# Patient Record
Sex: Male | Born: 1960 | Race: White | Hispanic: No | Marital: Married | Smoking: Never smoker
Health system: Southern US, Community
[De-identification: ages and names within clinical notes are randomized; demographics above are authoritative.]

## PROBLEM LIST (undated history)

## (undated) DIAGNOSIS — M1990 Unspecified osteoarthritis, unspecified site: Secondary | ICD-10-CM

## (undated) DIAGNOSIS — C801 Malignant (primary) neoplasm, unspecified: Secondary | ICD-10-CM

## (undated) DIAGNOSIS — I10 Essential (primary) hypertension: Secondary | ICD-10-CM

## (undated) HISTORY — PX: OTHER SURGICAL HISTORY: SHX169

---

## 2013-12-03 ENCOUNTER — Encounter (HOSPITAL_COMMUNITY): Payer: Self-pay | Admitting: Emergency Medicine

## 2013-12-03 ENCOUNTER — Emergency Department (HOSPITAL_COMMUNITY)
Admission: EM | Admit: 2013-12-03 | Discharge: 2013-12-03 | Disposition: A | Discharge status: Home / Self Care | Payer: 59 | Attending: Emergency Medicine | Admitting: Emergency Medicine

## 2013-12-03 DIAGNOSIS — L03119 Cellulitis of unspecified part of limb: Principal | ICD-10-CM

## 2013-12-03 DIAGNOSIS — L02419 Cutaneous abscess of limb, unspecified: Secondary | ICD-10-CM | POA: Insufficient documentation

## 2013-12-03 DIAGNOSIS — Z792 Long term (current) use of antibiotics: Secondary | ICD-10-CM | POA: Insufficient documentation

## 2013-12-03 DIAGNOSIS — I10 Essential (primary) hypertension: Secondary | ICD-10-CM | POA: Insufficient documentation

## 2013-12-03 DIAGNOSIS — Z880 Allergy status to penicillin: Secondary | ICD-10-CM | POA: Insufficient documentation

## 2013-12-03 DIAGNOSIS — L03116 Cellulitis of left lower limb: Secondary | ICD-10-CM

## 2013-12-03 DIAGNOSIS — Z981 Arthrodesis status: Secondary | ICD-10-CM | POA: Insufficient documentation

## 2013-12-03 DIAGNOSIS — Z791 Long term (current) use of non-steroidal anti-inflammatories (NSAID): Secondary | ICD-10-CM | POA: Insufficient documentation

## 2013-12-03 DIAGNOSIS — Z79899 Other long term (current) drug therapy: Secondary | ICD-10-CM | POA: Insufficient documentation

## 2013-12-03 HISTORY — DX: Essential (primary) hypertension: I10

## 2013-12-03 LAB — CBC WITH DIFFERENTIAL/PLATELET
Basophils Absolute: 0 10*3/uL (ref 0.0–0.1)
Basophils Relative: 0 % (ref 0–1)
Eosinophils Absolute: 0 10*3/uL (ref 0.0–0.7)
Eosinophils Relative: 0 % (ref 0–5)
HCT: 42.3 % (ref 39.0–52.0)
Hemoglobin: 14.1 g/dL (ref 13.0–17.0)
LYMPHS ABS: 1.3 10*3/uL (ref 0.7–4.0)
LYMPHS PCT: 14 % (ref 12–46)
MCH: 29.9 pg (ref 26.0–34.0)
MCHC: 33.3 g/dL (ref 30.0–36.0)
MCV: 89.8 fL (ref 78.0–100.0)
Monocytes Absolute: 0.5 10*3/uL (ref 0.1–1.0)
Monocytes Relative: 6 % (ref 3–12)
NEUTROS PCT: 80 % — AB (ref 43–77)
Neutro Abs: 7.5 10*3/uL (ref 1.7–7.7)
PLATELETS: 266 10*3/uL (ref 150–400)
RBC: 4.71 MIL/uL (ref 4.22–5.81)
RDW: 12.9 % (ref 11.5–15.5)
WBC: 9.4 10*3/uL (ref 4.0–10.5)

## 2013-12-03 LAB — BASIC METABOLIC PANEL
ANION GAP: 11 (ref 5–15)
BUN: 14 mg/dL (ref 6–23)
CO2: 29 meq/L (ref 19–32)
Calcium: 9.5 mg/dL (ref 8.4–10.5)
Chloride: 100 mEq/L (ref 96–112)
Creatinine, Ser: 0.77 mg/dL (ref 0.50–1.35)
GFR calc Af Amer: >90 mL/min (ref >90)
GFR calc non Af Amer: >90 mL/min (ref >90)
GLUCOSE: 112 mg/dL — AB (ref 70–99)
Potassium: 4 mEq/L (ref 3.7–5.3)
SODIUM: 140 meq/L (ref 137–147)

## 2013-12-03 MED ORDER — CLINDAMYCIN HCL 150 MG PO CAPS: 450.0000 mg | ORAL_CAPSULE | Freq: Three times a day (TID) | ORAL | Status: AC

## 2013-12-03 MED ORDER — CLINDAMYCIN PHOSPHATE 600 MG/50ML IV SOLN
600.0000 mg | Freq: Once | INTRAVENOUS | Status: AC
  Administered 2013-12-03: 600 mg via INTRAVENOUS
  Filled 2013-12-03: qty 50

## 2013-12-03 NOTE — ED Notes (Addendum)
Per patient- starting 6/14 having F, chills, vomiting. No vomiting since that time. Denies diarrhea. No left leg swelling or redness at that time. Starting Tuesday after 6/14 noticed redness and swelling on left leg. Denies cut in skin or any bug bite or break in skin. Hx staph infection in left ankle in 1992 with debridement. Left ankle was fused in 1995. Has been on multiple regimens of vancomycin and gentamycin. Denies DM or HTN. Has had recurrent staph infections since 1992. Leg visibly edematous, reddened and warm compared to right leg. Per wife-maroon discoloration on left leg is baseline for patient but redness around this is new. Was seen in urgent care last Wednesday on [address]-prescribed doxycycline and is still taking this antibiotic. Takes gabapentin for neuropathy. In NAD. Awaiting further treatment.

## 2013-12-03 NOTE — ED Provider Notes (Signed)
CSN: [id number]     Arrival date & time 12/03/13  1813 History   First MD Initiated Contact with Patient 12/03/13 1905     Chief Complaint  Patient presents with  . Cellulitis     (Consider location/radiation/quality/duration/timing/severity/associated sxs/prior Treatment) Patient is a 53 y.o. male presenting with leg pain. The history is provided by the patient. No language interpreter was used.  Leg Pain Location:  Leg Time since incident:  10 days Injury: no   Leg location:  L lower leg Pain details:    Quality:  Aching   Radiates to:  Does not radiate   Severity:  Moderate   Onset quality:  Gradual   Duration:  10 days   Timing:  Constant   Subjective pain progression: mildly improved. Chronicity:  New Dislocation: no   Foreign body present:  No foreign bodies Tetanus status:  Up to date Prior injury to area: Prior staph infection and ankle fixation. Relieved by:  Elevation Worsened by:  Bearing weight Ineffective treatments: PO doxy 10 days. Associated symptoms: swelling   Associated symptoms: no back pain, no fatigue and no fever   Risk factors: obesity     Past Medical History  Diagnosis Date  . Hypertension    Past Surgical History  Procedure Laterality Date  . Left ankle fusion     History reviewed. No pertinent family history. History  Substance Use Topics  . Smoking status: Never Smoker   . Smokeless tobacco: Never Used  . Alcohol Use: No    Review of Systems  Constitutional: Negative for fever, activity change, appetite change and fatigue.  HENT: Negative for congestion, facial swelling, rhinorrhea and trouble swallowing.   Eyes: Negative for photophobia and pain.  Respiratory: Negative for cough, chest tightness and shortness of breath.   Cardiovascular: Positive for leg swelling. Negative for chest pain.  Gastrointestinal: Negative for nausea, vomiting, abdominal pain, diarrhea and constipation.  Endocrine: Negative for polydipsia and polyuria.   Genitourinary: Negative for dysuria, urgency, decreased urine volume and difficulty urinating.  Musculoskeletal: Negative for back pain and gait problem.  Skin: Positive for color change and rash. Negative for wound.  Allergic/Immunologic: Negative for immunocompromised state.  Neurological: Negative for dizziness, facial asymmetry, speech difficulty, weakness, numbness and headaches.  Psychiatric/Behavioral: Negative for confusion, decreased concentration and agitation.      Allergies  Penicillins  Home Medications   Prior to Admission medications   Medication Sig Start Date End Date Taking? Authorizing Provider  doxycycline (VIBRA-TABS) 100 MG tablet Take 100 mg by mouth 2 (two) times daily.   Yes Historical Provider, MD  gabapentin (NEURONTIN) 100 MG capsule Take 100 mg by mouth 4 (four) times daily.   Yes Historical Provider, MD  ibuprofen (ADVIL,MOTRIN) 200 MG tablet Take 200 mg by mouth every 6 (six) hours as needed for moderate pain.   Yes Historical Provider, MD  meloxicam (MOBIC) 7.5 MG tablet Take 7.5 mg by mouth 2 (two) times daily.   Yes Historical Provider, MD  clindamycin (CLEOCIN) 150 MG capsule Take 3 capsules (450 mg total) by mouth 3 (three) times daily. For 10 full days 12/03/13 12/12/13  Neta Ehlers, MD   BP 169/88  Pulse 85  Temp(Src) 98.6 F (37 C) (Oral)  Resp 16  SpO2 100% Physical Exam  Constitutional: He is oriented to person, place, and time. He appears well-developed and well-nourished. No distress.  HENT:  Head: Normocephalic and atraumatic.  Mouth/Throat: No oropharyngeal exudate.  Eyes: Pupils are equal, round,  and reactive to light.  Neck: Normal range of motion. Neck supple.  Cardiovascular: Normal rate, regular rhythm and normal heart sounds.  Exam reveals no gallop and no friction rub.   No murmur heard. Pulmonary/Chest: Effort normal and breath sounds normal. No respiratory distress. He has no wheezes. He has no rales.  Abdominal: Soft.  Bowel sounds are normal. He exhibits no distension and no mass. There is no tenderness. There is no rebound and no guarding.  Musculoskeletal: Normal range of motion. He exhibits no edema and no tenderness.       Left lower leg: He exhibits swelling.       Legs: Erythema, warmth, edema to LLE extending past area of chronic appearing hyperpigmentatio  Neurological: He is alert and oriented to person, place, and time.  Skin: Skin is warm and dry.  Psychiatric: He has a normal mood and affect.    ED Course  Procedures (including critical care time) Labs Review Labs Reviewed  CBC WITH DIFFERENTIAL - Abnormal; Notable for the following:    Neutrophils Relative % 80 (*)    All other components within normal limits  BASIC METABOLIC PANEL - Abnormal; Notable for the following:    Glucose, Bld 112 (*)    All other components within normal limits    Imaging Review No results found.   EKG Interpretation None      MDM   Final diagnoses:  Cellulitis of left lower extremity    Pt is a 53 y.o. male with Pmhx as above who presents with inc warmth, redness, and pain in LLE c/w recurrent cellulitis. Pt has been on doxycyline for about 10 mins with mild improvement. No systemic s/sx of infection in past week. On PE, VSS, pt in NAD. LLE as above. Have discussed outpt treatment w/ trial of alternate PO abx regimen vs admission for IV abx. Pt prefers outpt treatment, but agrees to return if he is not improved in 2 days or develops worsening redness, fever, or streaking. Will start PO clindamycin for 10 days.          Neta Ehlers, MD 12/03/13 2013

## 2013-12-03 NOTE — Discharge Instructions (Signed)
Cellulitis Cellulitis is an infection of the skin and the tissue beneath it. The infected area is usually red and tender. Cellulitis occurs most often in the arms and lower legs.  CAUSES  Cellulitis is caused by bacteria that enter the skin through cracks or cuts in the skin. The most common types of bacteria that cause cellulitis are Staphylococcus and Streptococcus. SYMPTOMS   Redness and warmth.  Swelling.  Tenderness or pain.  Fever. DIAGNOSIS  Your caregiver can usually determine what is wrong based on a physical exam. Blood tests may also be done. TREATMENT  Treatment usually involves taking an antibiotic medicine. HOME CARE INSTRUCTIONS   Take your antibiotics as directed. Finish them even if you start to feel better.  Keep the infected arm or leg elevated to reduce swelling.  Apply a warm cloth to the affected area up to 4 times per day to relieve pain.  Only take over-the-counter or prescription medicines for pain, discomfort, or fever as directed by your caregiver.  Keep all follow-up appointments as directed by your caregiver. SEEK MEDICAL CARE IF:   You notice red streaks coming from the infected area.  Your red area gets larger or turns dark in color.  Your bone or joint underneath the infected area becomes painful after the skin has healed.  Your infection returns in the same area or another area.  You notice a swollen bump in the infected area.  You develop new symptoms. SEEK IMMEDIATE MEDICAL CARE IF:   You have a fever.  You feel very sleepy.  You develop vomiting or diarrhea.  You have a general ill feeling (malaise) with muscle aches and pains. MAKE SURE YOU:   Understand these instructions.  Will watch your condition.  Will get help right away if you are not doing well or get worse. Document Released: 02/27/2005 Document Revised: 11/19/2011 Document Reviewed: 08/05/2011 Exit[employer] Patient Information 2015 [employer]. This information is  not intended to replace advice given to you by your health care provider. Make sure you discuss any questions you have with your health care provider.  

## 2013-12-09 ENCOUNTER — Emergency Department (HOSPITAL_COMMUNITY)
Admission: EM | Admit: 2013-12-09 | Discharge: 2013-12-09 | Disposition: A | Discharge status: Home / Self Care | Payer: 59 | Attending: Emergency Medicine | Admitting: Emergency Medicine

## 2013-12-09 ENCOUNTER — Encounter (HOSPITAL_COMMUNITY): Payer: Self-pay | Admitting: Emergency Medicine

## 2013-12-09 DIAGNOSIS — Z880 Allergy status to penicillin: Secondary | ICD-10-CM | POA: Insufficient documentation

## 2013-12-09 DIAGNOSIS — Z791 Long term (current) use of non-steroidal anti-inflammatories (NSAID): Secondary | ICD-10-CM | POA: Insufficient documentation

## 2013-12-09 DIAGNOSIS — L03116 Cellulitis of left lower limb: Secondary | ICD-10-CM

## 2013-12-09 DIAGNOSIS — M79609 Pain in unspecified limb: Secondary | ICD-10-CM

## 2013-12-09 DIAGNOSIS — Z79899 Other long term (current) drug therapy: Secondary | ICD-10-CM | POA: Insufficient documentation

## 2013-12-09 DIAGNOSIS — I10 Essential (primary) hypertension: Secondary | ICD-10-CM | POA: Insufficient documentation

## 2013-12-09 DIAGNOSIS — Z792 Long term (current) use of antibiotics: Secondary | ICD-10-CM | POA: Insufficient documentation

## 2013-12-09 DIAGNOSIS — M7989 Other specified soft tissue disorders: Secondary | ICD-10-CM

## 2013-12-09 DIAGNOSIS — R000 Tachycardia, unspecified: Secondary | ICD-10-CM | POA: Insufficient documentation

## 2013-12-09 DIAGNOSIS — L02419 Cutaneous abscess of limb, unspecified: Secondary | ICD-10-CM | POA: Insufficient documentation

## 2013-12-09 DIAGNOSIS — L03119 Cellulitis of unspecified part of limb: Principal | ICD-10-CM

## 2013-12-09 LAB — CBC WITH DIFFERENTIAL/PLATELET
Basophils Absolute: 0 10*3/uL (ref 0.0–0.1)
Basophils Relative: 0 % (ref 0–1)
EOS PCT: 1 % (ref 0–5)
Eosinophils Absolute: 0.1 10*3/uL (ref 0.0–0.7)
HCT: 42.8 % (ref 39.0–52.0)
Hemoglobin: 14.4 g/dL (ref 13.0–17.0)
LYMPHS ABS: 1.2 10*3/uL (ref 0.7–4.0)
Lymphocytes Relative: 14 % (ref 12–46)
MCH: 29.9 pg (ref 26.0–34.0)
MCHC: 33.6 g/dL (ref 30.0–36.0)
MCV: 89 fL (ref 78.0–100.0)
Monocytes Absolute: 0.6 10*3/uL (ref 0.1–1.0)
Monocytes Relative: 7 % (ref 3–12)
NEUTROS ABS: 6.8 10*3/uL (ref 1.7–7.7)
Neutrophils Relative %: 78 % — ABNORMAL HIGH (ref 43–77)
PLATELETS: 324 10*3/uL (ref 150–400)
RBC: 4.81 MIL/uL (ref 4.22–5.81)
RDW: 12.7 % (ref 11.5–15.5)
WBC: 8.7 10*3/uL (ref 4.0–10.5)

## 2013-12-09 LAB — BASIC METABOLIC PANEL
ANION GAP: 15 (ref 5–15)
BUN: 10 mg/dL (ref 6–23)
CHLORIDE: 98 meq/L (ref 96–112)
CO2: 26 meq/L (ref 19–32)
CREATININE: 0.75 mg/dL (ref 0.50–1.35)
Calcium: 9.8 mg/dL (ref 8.4–10.5)
GFR calc non Af Amer: >90 mL/min (ref >90)
Glucose, Bld: 105 mg/dL — ABNORMAL HIGH (ref 70–99)
POTASSIUM: 3.9 meq/L (ref 3.7–5.3)
SODIUM: 139 meq/L (ref 137–147)

## 2013-12-09 LAB — I-STAT CG4 LACTIC ACID, ED: LACTIC ACID, VENOUS: 1.04 mmol/L (ref 0.5–2.2)

## 2013-12-09 MED ORDER — HYDROCODONE-ACETAMINOPHEN 5-325 MG PO TABS: 2.0000 | ORAL_TABLET | ORAL | Status: DC | PRN

## 2013-12-09 MED ORDER — HYDROMORPHONE HCL PF 1 MG/ML IJ SOLN
1.0000 mg | Freq: Once | INTRAMUSCULAR | Status: AC
  Administered 2013-12-09: 1 mg via INTRAMUSCULAR
  Filled 2013-12-09: qty 1

## 2013-12-09 MED ORDER — AMLODIPINE BESYLATE 5 MG PO TABS
5.0000 mg | ORAL_TABLET | Freq: Once | ORAL | Status: AC
  Administered 2013-12-09: 5 mg via ORAL
  Filled 2013-12-09: qty 1

## 2013-12-09 NOTE — Discharge Instructions (Signed)
If you were given medicines take as directed.  If you are on coumadin or contraceptives realize their levels and effectiveness is altered by many different medicines.  If you have any reaction (rash, tongues swelling, other) to the medicines stop taking and see a physician.   Please follow up as directed and return to the ER or see a physician for new or worsening symptoms (spreading redness, fevers, shortness of breath...).  Thank you.

## 2013-12-09 NOTE — ED Notes (Signed)
Pt has cellulitis of lt lower leg since fathers day.  States that he was seen here and took his abx but it has not gotten any better.  Redness, swelling noted to leg.

## 2013-12-09 NOTE — Progress Notes (Signed)
  CARE MANAGEMENT ED NOTE 12/09/2013  Patient:  [name]   Account Number:  [id number]  Date Initiated:  12/09/2013  Documentation initiated by:  Jackelyn Poling  Subjective/Objective Assessment:   53 yr old [employer] c/o cellulitis of lt lower leg who recently moved from "[place]" to [place]     Subjective/Objective Assessment Detail:   Male at bedside and pt confirm no pcp  Male inquired about pcps that cover all services in one setting     Action/Plan:   ED Cm spoke with pt and family Cm provided a list of primary care provider in network with [facility] in group settings and a list of women's care providers for male   Action/Plan Detail:   Anticipated DC Date:       Status Recommendation to Physician:   Result of Recommendation:    Other ED Services  Consult [facility] [place]  Other  Outpatient Services - Pt will follow up  PCP issues    Choice offered to / List presented to:            Status of service:  Completed, signed off  ED Comments:   ED Comments Detail:

## 2013-12-09 NOTE — ED Provider Notes (Addendum)
CSN: [id number]     Arrival date & time 12/09/13  1222 History   First MD Initiated Contact with Patient 12/09/13 1358     Chief Complaint  Patient presents with  . Cellulitis     (Consider location/radiation/quality/duration/timing/severity/associated sxs/prior Treatment) HPI Comments: 53 year old male with history of left ankle fusion, high blood pressure presents with left lower leg pain. Patient developed cellulitis and left leg pain and father stay and completed a course of doxycycline and however patient had worsening signs and symptoms and was given IV followed by oral clindamycin in the ED. I reviewed nursing note and clarified with the patient he does feel that cellulitic component has improved in the redness, warmth has improved and has a previous picture to compare to. His concern is that is persistent pain has not gotten better. He has mild swelling a lower leg chronically since his left ankle fusion. No blood clot history, recent surgeries, hemoptysis or shortness of breath, active cancer. Patient currently taking his clindamycin as directed. Pain with walking and palpation  The history is provided by the patient.    Past Medical History  Diagnosis Date  . Hypertension    Past Surgical History  Procedure Laterality Date  . Left ankle fusion     No family history on file. History  Substance Use Topics  . Smoking status: Never Smoker   . Smokeless tobacco: Never Used  . Alcohol Use: No    Review of Systems  Constitutional: Negative for fever and chills.  Respiratory: Negative for shortness of breath.   Cardiovascular: Negative for chest pain.  Gastrointestinal: Negative for vomiting and abdominal pain.  Musculoskeletal: Negative for neck pain and neck stiffness.  Skin: Positive for rash.  Neurological: Negative for weakness, light-headedness, numbness and headaches.      Allergies  Penicillins  Home Medications   Prior to Admission medications   Medication  Sig Start Date End Date Taking? Authorizing Provider  clindamycin (CLEOCIN) 150 MG capsule Take 3 capsules (450 mg total) by mouth 3 (three) times daily. For 10 full days 12/03/13 12/12/13 Yes Neta Ehlers, MD  gabapentin (NEURONTIN) 100 MG capsule Take 100 mg by mouth 4 (four) times daily.   Yes Historical Provider, MD  ibuprofen (ADVIL,MOTRIN) 200 MG tablet Take 200 mg by mouth every 6 (six) hours as needed for moderate pain.   Yes Historical Provider, MD  meloxicam (MOBIC) 7.5 MG tablet Take 7.5 mg by mouth 2 (two) times daily.   Yes Historical Provider, MD  naproxen sodium (ANAPROX) 220 MG tablet Take 440 mg by mouth 2 (two) times daily with a meal.   Yes Historical Provider, MD   BP 163/120  Pulse 118  Temp(Src) 98.3 F (36.8 C) (Oral)  Resp 22  SpO2 98% Physical Exam  Nursing note and vitals reviewed. Constitutional: He is oriented to person, place, and time. He appears well-developed and well-nourished.  HENT:  Head: Normocephalic and atraumatic.  Eyes: Conjunctivae are normal. Right eye exhibits no discharge. Left eye exhibits no discharge.  Neck: Normal range of motion. Neck supple. No tracheal deviation present.  Cardiovascular: Regular rhythm.  Tachycardia present.   Pulmonary/Chest: Effort normal and breath sounds normal.  Abdominal: Soft. He exhibits no distension. There is no tenderness. There is no guarding.  Musculoskeletal: He exhibits no edema.  Neurological: He is alert and oriented to person, place, and time.  Skin: Skin is warm. No rash noted.  Patient has chronic changes left lower extremity anterior and lateral leg  with dry skin. Patient has mild erythema anterior lower extremity and medial foot. Patient mild tender to palpation lower extremity lateral and medial soft compartments. Sensation Refill intact lower extremity. Dorsalis pedis pulse 2+.  Psychiatric: He has a normal mood and affect.    ED Course  Procedures (including critical care time) Labs  Review Labs Reviewed  CBC WITH DIFFERENTIAL - Abnormal; Notable for the following:    Neutrophils Relative % 78 (*)    All other components within normal limits  BASIC METABOLIC PANEL  I-STAT CG4 LACTIC ACID, ED    Imaging Review No results found.    EKG Interpretation None      MDM   Final diagnoses:  Cellulitis of left leg   Patient with recent cellulitis and currently on antibiotics with improving erythema however persistent pain. With chronic changes in his leg since his surgery discussed this may be why his more sensitive and having more prolonged pain however plan for ultrasound of his legs look for signs of blood clot as the cause. Patient held the otherwise. Pain has been for 3 weeks. Patient denies systemic symptoms he would prefer outpatient followup as he has no systemic symptoms currently. Patient heart rate tachycardic likely from pain however will reassess after IM pain medicines. Blood pressure elevated will reassess.  Patient's pain improved in the ER no signs of worsening infection. Ultrasound negative for deep vein thrombosis. Close follow up outpatient with primary Dr. Discussed.  Pain medicines for home, pt on clindamycin currently. Blood work pending prior to dc, bp elevated, pt will need close fup outpt, pt has no sxs or signs of end organ damage.   Results and differential diagnosis were discussed with the patient/parent/guardian. Close follow up outpatient was discussed, comfortable with the plan.   Medications  amLODipine (NORVASC) tablet 5 mg (not administered)  HYDROmorphone (DILAUDID) injection 1 mg (1 mg Intramuscular Given 12/09/13 1452)    Filed Vitals:   12/09/13 1245 12/09/13 1516 12/09/13 1553  BP: 163/120 168/98 186/99  Pulse: 118  78  Temp: 98.3 F (36.8 C)    TempSrc: Oral    Resp: 22    SpO2: 98%  98%       Mariea Clonts, MD 12/09/13 1629  Discussed results with patient on recheck blood pressure still elevated. Patient says cc  supposed to be taking blood pressure meds however has not been taking his evaluate interactive with his clindamycin. I discussed importance of controlling her blood pressure and close followup with her primary Dr. Patient understands the long-term risks and I ordered his oral dose to start in ER.  Mariea Clonts, MD 12/09/13 [phone]

## 2013-12-09 NOTE — Progress Notes (Signed)
VASCULAR LAB PRELIMINARY  PRELIMINARY  PRELIMINARY  PRELIMINARY  Left lower extremity venous Doppler completed.    Preliminary report:  There is no DVT or SVT noted in the left lower extremity.   Dannie Woolen, RVT 12/09/2013, 4:24 PM

## 2013-12-27 ENCOUNTER — Encounter (HOSPITAL_COMMUNITY): Payer: Self-pay | Admitting: Pharmacy Technician

## 2013-12-27 NOTE — Pre-Procedure Instructions (Addendum)
[facility]  12/27/2013   Your procedure is scheduled on:  Thursday, July 30.  Report to [facility] Admitting at 11:00 AM.  Call this number if you have problems the morning of surgery: [phone]   Remember:   Do not eat food or drink liquids after midnight Wednesday, July 29.   Take these medicines the morning of surgery with A SIP OF WATER: amLODipine (NORVASC),  gabapentin (NEURONTIN).              Stop taking ibuprofen (ADVIL,MOTRIN), meloxicam (MOBIC),  naproxen sodium (ANAPROX).   Do not wear jewelry, make-up or nail polish.  Do not wear lotions, powders, or perfumes.    Men may shave face and neck.  Do not bring valuables to the hospital.               [facility] is not responsible for any belongings or valuables.               Contacts, dentures or bridgework may not be worn into surgery.  Leave suitcase in the car. After surgery it may be brought to your room.  For patients admitted to the hospital, discharge time is determined by yourtreatment team.               Patients discharged the day of surgery will not be allowed to drive home.  Name and phone number of your driver: -   Special Instructions: [place] - Preparing for Surgery  Before surgery, you can play an important role.  Because skin is not sterile, your skin needs to be as free of germs as possible.  You can reduce the number of germs on you skin by washing with CHG (chlorahexidine gluconate) soap before surgery.  CHG is an antiseptic cleaner which kills germs and bonds with the skin to continue killing germs even after washing.  Please DO NOT use if you have an allergy to CHG or antibacterial soaps.  If your skin becomes reddened/irritated stop using the CHG and inform your nurse when you arrive at Short Stay.  Do not shave (including legs and underarms) for at least 48 hours prior to the first CHG shower.  You may shave your face.  Please follow these instructions carefully:   1.  Shower  with CHG Soap the night before surgery and the                                morning of Surgery.  2.  If you choose to wash your hair, wash your hair first as usual with your       normal shampoo.  3.  After you shampoo, rinse your hair and body thoroughly to remove the                      Shampoo.  4.  Use CHG as you would any other liquid soap.  You can apply chg directly       to the skin and wash gently with scrungie or a clean washcloth.  5.  Apply the CHG Soap to your body ONLY FROM THE NECK DOWN.        Do not use on open wounds or open sores.  Avoid contact with your eyes,       ears, mouth and genitals (private parts).  Wash genitals (private parts)       with your normal  soap.  6.  Wash thoroughly, paying special attention to the area where your surgery        will be performed.  7.  Thoroughly rinse your body with warm water from the neck down.  8.  DO NOT shower/wash with your normal soap after using and rinsing off       the CHG Soap.  9.  Pat yourself dry with a clean towel.            10.  Wear clean pajamas.            11.  Place clean sheets on your bed the night of your first shower and do not        sleep with pets.  Day of Surgery  Do not apply any lotions/deoderants the morning of surgery.  Please wear clean clothes to the hospital/surgery center.      Please read over the following fact sheets that you were given: Pain Booklet, Coughing and Deep Breathing and Surgical Site Infection Prevention

## 2013-12-28 ENCOUNTER — Encounter (HOSPITAL_COMMUNITY)
Admission: RE | Admit: 2013-12-28 | Discharge: 2013-12-28 | Disposition: A | Discharge status: Home / Self Care | Payer: 59 | Source: Ambulatory Visit | Attending: Orthopedic Surgery | Admitting: Orthopedic Surgery

## 2013-12-28 ENCOUNTER — Encounter (HOSPITAL_COMMUNITY): Payer: Self-pay

## 2013-12-28 HISTORY — DX: Malignant (primary) neoplasm, unspecified: C80.1

## 2013-12-28 LAB — CBC
HCT: 42.2 % (ref 39.0–52.0)
Hemoglobin: 14 g/dL (ref 13.0–17.0)
MCH: 29.7 pg (ref 26.0–34.0)
MCHC: 33.2 g/dL (ref 30.0–36.0)
MCV: 89.6 fL (ref 78.0–100.0)
Platelets: 233 10*3/uL (ref 150–400)
RBC: 4.71 MIL/uL (ref 4.22–5.81)
RDW: 13 % (ref 11.5–15.5)
WBC: 10.8 10*3/uL — ABNORMAL HIGH (ref 4.0–10.5)

## 2013-12-28 LAB — BASIC METABOLIC PANEL
ANION GAP: 13 (ref 5–15)
BUN: 16 mg/dL (ref 6–23)
CHLORIDE: 101 meq/L (ref 96–112)
CO2: 29 mEq/L (ref 19–32)
Calcium: 9.5 mg/dL (ref 8.4–10.5)
Creatinine, Ser: 0.78 mg/dL (ref 0.50–1.35)
GFR calc non Af Amer: >90 mL/min (ref >90)
Glucose, Bld: 115 mg/dL — ABNORMAL HIGH (ref 70–99)
Potassium: 3.7 mEq/L (ref 3.7–5.3)
SODIUM: 143 meq/L (ref 137–147)

## 2013-12-29 ENCOUNTER — Other Ambulatory Visit: Payer: Self-pay | Admitting: Orthopedic Surgery

## 2013-12-29 NOTE — Progress Notes (Signed)
Anesthesia Chart Review:  Patient is a 53 year old male posted for left ankle removal of deep implant, I&D of left ankle wound on 12/30/13 by Dr. Doran Durand.  History includes non-smoker, HTN, melanoma of the scalp, left ankle fusion '90's. BMI is 42.95 consistent with morbid obesity.  PCP is Dr. Suzanna Obey with [facility] (see Care Everywhere)--but just established on 12/16/13 after ED visit for left ankle cellulitis despite two courses of oral antibiotics.  Notes indicate that he previously lived in "[place]  EKG on 12/30/13 showed NSR, right BBB, minimal voltage criteria for LVH, may be normal variant.  Currently, there are no comparison EKGs available. No CV symptoms documented at his PAT visit.  CXR on 12/28/13 showed: No active cardiopulmonary disease.  MRI of the left ankle from 12/21/13 showed:  1. Previous tibiotalar and subtalar fusion with bony bridging at the fracture site.  2. No evidence of osteomyelitis or abscess. 3. Extensive apparent cellulitis about the ankle with ulceration of the lateral ankle and hindfoot with scarring granulation tissue extending down to the distal lateral malleolus and lateral aspect of the talus.  4. There is lateral subluxation of the peroneal tendons about the tip of the fibula with the peroneal tendons extending through the granulation tissue and of the base of the ulceration of the lateral ankle with mild focal apparent infectious tenosynovitis at the ulcer base.  5. Moderate degenerative changes of the hindfoot midfoot and midfoot forefoot joints with mild plantar fasciitis.   Preoperative labs noted.  Cr 0.78, glucose 115, WBC 10.8, H/H 14.0/42.2.   EKG reviewed with anesthesiologist Dr. Glennon Mac.  Further evaluation by his assigned anesthesiologist on the day of surgery.  He has no reported history of CAD/MI/CHF or DM.  No reported CV symptoms.  If no acute changes or new CV symptoms then it is anticipated that he can proceed as planned.  George [facility] Short Stay Center/Anesthesiology Phone [phone] 12/29/2013 12:07 PM

## 2013-12-30 ENCOUNTER — Inpatient Hospital Stay (HOSPITAL_COMMUNITY)
Admission: RE | Admit: 2013-12-30 | Discharge: 2014-01-04 | DRG: 493 | Disposition: A | Discharge status: Home / Self Care | Payer: 59 | Source: Ambulatory Visit | Attending: Orthopedic Surgery | Admitting: Orthopedic Surgery

## 2013-12-30 ENCOUNTER — Encounter (HOSPITAL_COMMUNITY): Payer: Self-pay

## 2013-12-30 ENCOUNTER — Encounter (HOSPITAL_COMMUNITY)
Admission: RE | Disposition: A | Discharge status: Home / Self Care | Payer: Self-pay | Source: Ambulatory Visit | Attending: Orthopedic Surgery

## 2013-12-30 ENCOUNTER — Ambulatory Visit (HOSPITAL_COMMUNITY): Payer: 59 | Admitting: Certified Registered"

## 2013-12-30 ENCOUNTER — Other Ambulatory Visit: Payer: Self-pay | Admitting: Physician Assistant

## 2013-12-30 ENCOUNTER — Encounter (HOSPITAL_COMMUNITY): Payer: 59 | Admitting: Vascular Surgery

## 2013-12-30 DIAGNOSIS — R739 Hyperglycemia, unspecified: Secondary | ICD-10-CM | POA: Diagnosis present

## 2013-12-30 DIAGNOSIS — G589 Mononeuropathy, unspecified: Secondary | ICD-10-CM | POA: Diagnosis present

## 2013-12-30 DIAGNOSIS — M25569 Pain in unspecified knee: Secondary | ICD-10-CM | POA: Diagnosis present

## 2013-12-30 DIAGNOSIS — I890 Lymphedema, not elsewhere classified: Secondary | ICD-10-CM | POA: Diagnosis present

## 2013-12-30 DIAGNOSIS — R7309 Other abnormal glucose: Secondary | ICD-10-CM | POA: Diagnosis present

## 2013-12-30 DIAGNOSIS — M109 Gout, unspecified: Secondary | ICD-10-CM | POA: Diagnosis present

## 2013-12-30 DIAGNOSIS — I10 Essential (primary) hypertension: Secondary | ICD-10-CM | POA: Diagnosis present

## 2013-12-30 DIAGNOSIS — Z872 Personal history of diseases of the skin and subcutaneous tissue: Secondary | ICD-10-CM

## 2013-12-30 DIAGNOSIS — T847XXA Infection and inflammatory reaction due to other internal orthopedic prosthetic devices, implants and grafts, initial encounter: Secondary | ICD-10-CM | POA: Diagnosis present

## 2013-12-30 DIAGNOSIS — Z981 Arthrodesis status: Secondary | ICD-10-CM

## 2013-12-30 DIAGNOSIS — M86679 Other chronic osteomyelitis, unspecified ankle and foot: Secondary | ICD-10-CM | POA: Diagnosis present

## 2013-12-30 DIAGNOSIS — Z6841 Body Mass Index (BMI) 40.0 and over, adult: Secondary | ICD-10-CM

## 2013-12-30 DIAGNOSIS — G629 Polyneuropathy, unspecified: Secondary | ICD-10-CM | POA: Diagnosis present

## 2013-12-30 HISTORY — PX: HARDWARE REMOVAL: SHX979

## 2013-12-30 LAB — GLUCOSE, CAPILLARY: GLUCOSE-CAPILLARY: 120 mg/dL — AB (ref 70–99)

## 2013-12-30 SURGERY — REMOVAL, HARDWARE
Anesthesia: Regional | Site: Ankle | Laterality: Left

## 2013-12-30 MED ORDER — FENTANYL CITRATE 0.05 MG/ML IJ SOLN
INTRAMUSCULAR | Status: AC
  Filled 2013-12-30: qty 5

## 2013-12-30 MED ORDER — MIDAZOLAM HCL 2 MG/2ML IJ SOLN
INTRAMUSCULAR | Status: AC
  Administered 2013-12-30: 1 mg via INTRAVENOUS
  Filled 2013-12-30: qty 2

## 2013-12-30 MED ORDER — HYDROMORPHONE HCL PF 1 MG/ML IJ SOLN
0.2500 mg | INTRAMUSCULAR | Status: DC | PRN
  Administered 2013-12-30 (×4): 0.5 mg via INTRAVENOUS

## 2013-12-30 MED ORDER — OXYCODONE HCL 5 MG/5ML PO SOLN: 5.0000 mg | Freq: Once | ORAL | Status: AC | PRN

## 2013-12-30 MED ORDER — HYDROMORPHONE HCL PF 1 MG/ML IJ SOLN
INTRAMUSCULAR | Status: AC
  Filled 2013-12-30: qty 1

## 2013-12-30 MED ORDER — FENTANYL CITRATE 0.05 MG/ML IJ SOLN
INTRAMUSCULAR | Status: DC | PRN
  Administered 2013-12-30 (×2): 100 ug via INTRAVENOUS

## 2013-12-30 MED ORDER — ONDANSETRON HCL 4 MG/2ML IJ SOLN: 4.0000 mg | Freq: Four times a day (QID) | INTRAMUSCULAR | Status: DC | PRN

## 2013-12-30 MED ORDER — STERILE WATER FOR INJECTION IJ SOLN
INTRAMUSCULAR | Status: AC
  Filled 2013-12-30: qty 10

## 2013-12-30 MED ORDER — LIDOCAINE HCL (CARDIAC) 20 MG/ML IV SOLN
INTRAVENOUS | Status: DC | PRN
  Administered 2013-12-30: 50 mg via INTRAVENOUS

## 2013-12-30 MED ORDER — ONDANSETRON HCL 4 MG/2ML IJ SOLN
INTRAMUSCULAR | Status: AC
Start: 2013-12-30 — End: 2013-12-30
  Filled 2013-12-30: qty 2

## 2013-12-30 MED ORDER — PROPOFOL 10 MG/ML IV BOLUS
INTRAVENOUS | Status: DC | PRN
  Administered 2013-12-30: 200 mg via INTRAVENOUS

## 2013-12-30 MED ORDER — VANCOMYCIN HCL IN DEXTROSE 1-5 GM/200ML-% IV SOLN
1000.0000 mg | Freq: Once | INTRAVENOUS | Status: AC
  Administered 2013-12-30: 1000 mg via INTRAVENOUS
  Filled 2013-12-30 (×2): qty 200

## 2013-12-30 MED ORDER — PIPERACILLIN-TAZOBACTAM 3.375 G IVPB
3.3750 g | Freq: Three times a day (TID) | INTRAVENOUS | Status: DC
  Administered 2013-12-30 – 2014-01-02 (×9): 3.375 g via INTRAVENOUS
  Filled 2013-12-30 (×16): qty 50

## 2013-12-30 MED ORDER — SUCCINYLCHOLINE CHLORIDE 20 MG/ML IJ SOLN
INTRAMUSCULAR | Status: AC
Start: 2013-12-30 — End: 2013-12-30
  Filled 2013-12-30: qty 1

## 2013-12-30 MED ORDER — VANCOMYCIN HCL 10 G IV SOLR
1250.0000 mg | Freq: Two times a day (BID) | INTRAVENOUS | Status: DC
  Administered 2013-12-30 – 2014-01-01 (×5): 1250 mg via INTRAVENOUS
  Filled 2013-12-30 (×8): qty 1250

## 2013-12-30 MED ORDER — ROCURONIUM BROMIDE 50 MG/5ML IV SOLN
INTRAVENOUS | Status: AC
  Filled 2013-12-30: qty 1

## 2013-12-30 MED ORDER — EPHEDRINE SULFATE 50 MG/ML IJ SOLN
INTRAMUSCULAR | Status: AC
  Filled 2013-12-30: qty 1

## 2013-12-30 MED ORDER — ACETAMINOPHEN 500 MG PO TABS
1000.0000 mg | ORAL_TABLET | Freq: Once | ORAL | Status: AC
  Administered 2013-12-30: 1000 mg via ORAL
  Filled 2013-12-30: qty 2

## 2013-12-30 MED ORDER — SUCCINYLCHOLINE CHLORIDE 20 MG/ML IJ SOLN
INTRAMUSCULAR | Status: DC | PRN
  Administered 2013-12-30: 120 mg via INTRAVENOUS

## 2013-12-30 MED ORDER — SODIUM CHLORIDE 0.9 % IJ SOLN
INTRAMUSCULAR | Status: AC
  Filled 2013-12-30: qty 10

## 2013-12-30 MED ORDER — BACITRACIN ZINC 500 UNIT/GM EX OINT
TOPICAL_OINTMENT | CUTANEOUS | Status: AC
  Filled 2013-12-30: qty 15

## 2013-12-30 MED ORDER — OXYCODONE HCL 5 MG PO TABS
5.0000 mg | ORAL_TABLET | ORAL | Status: DC | PRN
  Administered 2013-12-30 – 2014-01-04 (×21): 10 mg via ORAL
  Administered 2014-01-04: 5 mg via ORAL
  Filled 2013-12-30 (×22): qty 2

## 2013-12-30 MED ORDER — LACTATED RINGERS IV SOLN
INTRAVENOUS | Status: DC | PRN
  Administered 2013-12-30: 14:00:00 via INTRAVENOUS

## 2013-12-30 MED ORDER — 0.9 % SODIUM CHLORIDE (POUR BTL) OPTIME
TOPICAL | Status: DC | PRN
  Administered 2013-12-30: 1000 mL

## 2013-12-30 MED ORDER — PROPOFOL 10 MG/ML IV BOLUS
INTRAVENOUS | Status: AC
  Filled 2013-12-30: qty 20

## 2013-12-30 MED ORDER — SODIUM CHLORIDE 0.9 % IR SOLN
Status: DC | PRN
  Administered 2013-12-30: 3000 mL

## 2013-12-30 MED ORDER — CHLORHEXIDINE GLUCONATE 4 % EX LIQD
60.0000 mL | Freq: Once | CUTANEOUS | Status: DC
  Filled 2013-12-30: qty 60

## 2013-12-30 MED ORDER — MIDAZOLAM HCL 2 MG/2ML IJ SOLN
1.0000 mg | Freq: Once | INTRAMUSCULAR | Status: AC
  Administered 2013-12-30: 1 mg via INTRAVENOUS

## 2013-12-30 MED ORDER — METOCLOPRAMIDE HCL 5 MG/ML IJ SOLN: 5.0000 mg | Freq: Three times a day (TID) | INTRAMUSCULAR | Status: DC | PRN

## 2013-12-30 MED ORDER — OXYCODONE HCL 5 MG PO TABS
5.0000 mg | ORAL_TABLET | Freq: Once | ORAL | Status: AC | PRN
  Administered 2013-12-30: 5 mg via ORAL

## 2013-12-30 MED ORDER — METOCLOPRAMIDE HCL 10 MG PO TABS: 5.0000 mg | ORAL_TABLET | Freq: Three times a day (TID) | ORAL | Status: DC | PRN

## 2013-12-30 MED ORDER — BACITRACIN ZINC 500 UNIT/GM EX OINT
TOPICAL_OINTMENT | CUTANEOUS | Status: DC | PRN
  Administered 2013-12-30: 1 via TOPICAL

## 2013-12-30 MED ORDER — FENTANYL CITRATE 0.05 MG/ML IJ SOLN
50.0000 ug | Freq: Once | INTRAMUSCULAR | Status: AC
  Administered 2013-12-30: 50 ug via INTRAVENOUS

## 2013-12-30 MED ORDER — SODIUM CHLORIDE 0.9 % IV SOLN
INTRAVENOUS | Status: DC
  Administered 2013-12-30 (×2): via INTRAVENOUS
  Administered 2013-12-31: 75 mL/h via INTRAVENOUS

## 2013-12-30 MED ORDER — HYDROMORPHONE HCL PF 1 MG/ML IJ SOLN
0.5000 mg | INTRAMUSCULAR | Status: DC | PRN
  Administered 2013-12-30 – 2014-01-04 (×24): 1 mg via INTRAVENOUS
  Filled 2013-12-30 (×27): qty 1

## 2013-12-30 MED ORDER — MIDAZOLAM HCL 2 MG/2ML IJ SOLN
INTRAMUSCULAR | Status: AC
  Filled 2013-12-30: qty 2

## 2013-12-30 MED ORDER — LACTATED RINGERS IV SOLN
INTRAVENOUS | Status: DC
  Administered 2013-12-30: 13:00:00 via INTRAVENOUS

## 2013-12-30 MED ORDER — EPHEDRINE SULFATE 50 MG/ML IJ SOLN
INTRAMUSCULAR | Status: DC | PRN
  Administered 2013-12-30: 10 mg via INTRAVENOUS

## 2013-12-30 MED ORDER — DOCUSATE SODIUM 100 MG PO CAPS
100.0000 mg | ORAL_CAPSULE | Freq: Two times a day (BID) | ORAL | Status: DC
  Administered 2013-12-30 – 2014-01-02 (×4): 100 mg via ORAL
  Filled 2013-12-30 (×12): qty 1

## 2013-12-30 MED ORDER — BUPIVACAINE-EPINEPHRINE (PF) 0.5% -1:200000 IJ SOLN
INTRAMUSCULAR | Status: DC | PRN
  Administered 2013-12-30: 30 mL via PERINEURAL

## 2013-12-30 MED ORDER — ONDANSETRON HCL 4 MG PO TABS: 4.0000 mg | ORAL_TABLET | Freq: Four times a day (QID) | ORAL | Status: DC | PRN

## 2013-12-30 MED ORDER — OXYCODONE HCL 5 MG PO TABS
ORAL_TABLET | ORAL | Status: AC
  Filled 2013-12-30: qty 1

## 2013-12-30 MED ORDER — INSULIN ASPART 100 UNIT/ML ~~LOC~~ SOLN
0.0000 [IU] | Freq: Three times a day (TID) | SUBCUTANEOUS | Status: DC
  Administered 2014-01-01 (×2): 1 [IU] via SUBCUTANEOUS
  Administered 2014-01-02 (×2): 3 [IU] via SUBCUTANEOUS
  Administered 2014-01-02: 2 [IU] via SUBCUTANEOUS
  Administered 2014-01-03: 1 [IU] via SUBCUTANEOUS
  Administered 2014-01-03: 3 [IU] via SUBCUTANEOUS
  Administered 2014-01-03 – 2014-01-04 (×2): 1 [IU] via SUBCUTANEOUS

## 2013-12-30 MED ORDER — ENOXAPARIN SODIUM 40 MG/0.4ML ~~LOC~~ SOLN
40.0000 mg | SUBCUTANEOUS | Status: DC
  Administered 2013-12-31 – 2014-01-04 (×5): 40 mg via SUBCUTANEOUS
  Filled 2013-12-30 (×6): qty 0.4

## 2013-12-30 MED ORDER — FENTANYL CITRATE 0.05 MG/ML IJ SOLN
INTRAMUSCULAR | Status: AC
  Administered 2013-12-30: 50 ug via INTRAVENOUS
  Filled 2013-12-30: qty 2

## 2013-12-30 MED ORDER — ONDANSETRON HCL 4 MG/2ML IJ SOLN
INTRAMUSCULAR | Status: AC
  Filled 2013-12-30: qty 2

## 2013-12-30 MED ORDER — SENNA 8.6 MG PO TABS
1.0000 | ORAL_TABLET | Freq: Two times a day (BID) | ORAL | Status: DC
  Administered 2013-12-30 – 2014-01-02 (×4): 8.6 mg via ORAL
  Filled 2013-12-30 (×11): qty 1

## 2013-12-30 MED ORDER — ONDANSETRON HCL 4 MG/2ML IJ SOLN
INTRAMUSCULAR | Status: DC | PRN
  Administered 2013-12-30: 4 mg via INTRAVENOUS

## 2013-12-30 MED ORDER — SODIUM CHLORIDE 0.9 % IV SOLN
INTRAVENOUS | Status: DC
Start: 2013-12-30 — End: 2013-12-30

## 2013-12-30 SURGICAL SUPPLY — 52 items
BANDAGE ESMARK 6X9 LF (GAUZE/BANDAGES/DRESSINGS) ×1 IMPLANT
BLADE SURG 10 STRL SS (BLADE) ×3 IMPLANT
BNDG COHESIVE 4X5 TAN STRL (GAUZE/BANDAGES/DRESSINGS) ×3 IMPLANT
BNDG COHESIVE 6X5 TAN STRL LF (GAUZE/BANDAGES/DRESSINGS) IMPLANT
BNDG ESMARK 6X9 LF (GAUZE/BANDAGES/DRESSINGS) ×3
CANISTER SUCT 3000ML (MISCELLANEOUS) ×3 IMPLANT
CHLORAPREP W/TINT 26ML (MISCELLANEOUS) ×3 IMPLANT
COVER SURGICAL LIGHT HANDLE (MISCELLANEOUS) ×3 IMPLANT
CUFF TOURNIQUET SINGLE 34IN LL (TOURNIQUET CUFF) ×3 IMPLANT
CUFF TOURNIQUET SINGLE 44IN (TOURNIQUET CUFF) IMPLANT
DRAPE C-ARM 42X72 X-RAY (DRAPES) IMPLANT
DRAPE C-ARM MINI 42X72 WSTRAPS (DRAPES) ×3 IMPLANT
DRAPE U-SHAPE 47X51 STRL (DRAPES) ×3 IMPLANT
DRSG ADAPTIC 3X8 NADH LF (GAUZE/BANDAGES/DRESSINGS) ×3 IMPLANT
DRSG PAD ABDOMINAL 8X10 ST (GAUZE/BANDAGES/DRESSINGS) ×6 IMPLANT
ELECT REM PT RETURN 9FT ADLT (ELECTROSURGICAL) ×3
ELECTRODE REM PT RTRN 9FT ADLT (ELECTROSURGICAL) ×1 IMPLANT
GLOVE BIO SURGEON STRL SZ7 (GLOVE) ×6 IMPLANT
GLOVE BIO SURGEON STRL SZ8 (GLOVE) ×6 IMPLANT
GLOVE BIOGEL PI IND STRL 7.5 (GLOVE) ×1 IMPLANT
GLOVE BIOGEL PI IND STRL 8 (GLOVE) ×2 IMPLANT
GLOVE BIOGEL PI INDICATOR 7.5 (GLOVE) ×2
GLOVE BIOGEL PI INDICATOR 8 (GLOVE) ×4
GOWN STRL REUS W/ TWL LRG LVL3 (GOWN DISPOSABLE) ×2 IMPLANT
GOWN STRL REUS W/ TWL XL LVL3 (GOWN DISPOSABLE) ×1 IMPLANT
GOWN STRL REUS W/TWL LRG LVL3 (GOWN DISPOSABLE) ×4
GOWN STRL REUS W/TWL XL LVL3 (GOWN DISPOSABLE) ×2
KIT BASIN OR (CUSTOM PROCEDURE TRAY) ×3 IMPLANT
KIT ROOM TURNOVER OR (KITS) ×3 IMPLANT
NEEDLE 22X1 1/2 (OR ONLY) (NEEDLE) IMPLANT
NS IRRIG 1000ML POUR BTL (IV SOLUTION) ×3 IMPLANT
PACK ORTHO EXTREMITY (CUSTOM PROCEDURE TRAY) ×3 IMPLANT
PAD ARMBOARD 7.5X6 YLW CONV (MISCELLANEOUS) ×6 IMPLANT
PAD CAST 4YDX4 CTTN HI CHSV (CAST SUPPLIES) ×1 IMPLANT
PADDING CAST COTTON 4X4 STRL (CAST SUPPLIES) ×2
SOAP 2 % CHG 4 OZ (WOUND CARE) ×3 IMPLANT
SPONGE GAUZE 4X4 12PLY (GAUZE/BANDAGES/DRESSINGS) ×3 IMPLANT
SPONGE LAP 4X18 X RAY DECT (DISPOSABLE) IMPLANT
STAPLER VISISTAT 35W (STAPLE) IMPLANT
SUCTION FRAZIER TIP 10 FR DISP (SUCTIONS) ×3 IMPLANT
SUT ETHILON 2 0 FS 18 (SUTURE) ×9 IMPLANT
SUT PROLENE 3 0 PS 2 (SUTURE) IMPLANT
SUT VIC AB 2-0 CT1 27 (SUTURE)
SUT VIC AB 2-0 CT1 TAPERPNT 27 (SUTURE) IMPLANT
SUT VIC AB 3-0 PS2 18 (SUTURE)
SUT VIC AB 3-0 PS2 18XBRD (SUTURE) IMPLANT
SYR CONTROL 10ML LL (SYRINGE) IMPLANT
TOWEL OR 17X24 6PK STRL BLUE (TOWEL DISPOSABLE) ×3 IMPLANT
TOWEL OR 17X26 10 PK STRL BLUE (TOWEL DISPOSABLE) ×3 IMPLANT
TUBE CONNECTING 12'X1/4 (SUCTIONS) ×1
TUBE CONNECTING 12X1/4 (SUCTIONS) ×2 IMPLANT
WATER STERILE IRR 1000ML POUR (IV SOLUTION) ×3 IMPLANT

## 2013-12-30 NOTE — Brief Op Note (Signed)
12/30/2013  4:34 PM  PATIENT:  [name]  54 y.o. male  PRE-OPERATIVE DIAGNOSIS: 1.  Left ankle retained hardware s/p ankle arthrodesis      2.  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  Removal of deep implant from distal medial tibia 2.  Removal of deep implant from distal lateral tibia through a separate incision 3.  AP and lateral xrays of the left ankle 4.  Irrigation and debridement of lateral ankle wound 5.  Irrigation and debridement of medial ankle wound with debridement of talus  SURGEON:  Wylene Simmer, MD  ASSISTANT:   ANESTHESIA:   General, regional  EBL:  minimal   TOURNIQUET:   Total Tourniquet Time Documented: Thigh (Left) - 48 minutes Total: Thigh (Left) - 48 minutes  Specimens:  Deep tissue to micro for aerobic and anaerobic culture  COMPLICATIONS:  None apparent  DISPOSITION:  Extubated, awake and stable to recovery.  DICTATION ID:  [id number]

## 2013-12-30 NOTE — Anesthesia Preprocedure Evaluation (Signed)
Anesthesia Evaluation  Patient identified by MRN, date of birth, ID band Patient awake    Reviewed: Allergy & Precautions, H&P , NPO status , Patient's Chart, lab work & pertinent test results  Airway Mallampati: II  Neck ROM: full    Dental   Pulmonary neg pulmonary ROS,          Cardiovascular hypertension,     Neuro/Psych    GI/Hepatic   Endo/Other  Morbid obesity  Renal/GU      Musculoskeletal   Abdominal   Peds  Hematology   Anesthesia Other Findings   Reproductive/Obstetrics                           Anesthesia Physical Anesthesia Plan  ASA: II  Anesthesia Plan: General and Regional   Post-op Pain Management: MAC Combined w/ Regional for Post-op pain   Induction: Intravenous  Airway Management Planned: Oral ETT  Additional Equipment:   Intra-op Plan:   Post-operative Plan: Extubation in OR  Informed Consent: I have reviewed the patients History and Physical, chart, labs and discussed the procedure including the risks, benefits and alternatives for the proposed anesthesia with the patient or authorized representative who has indicated his/her understanding and acceptance.     Plan Discussed with: CRNA, Anesthesiologist and Surgeon  Anesthesia Plan Comments:         Anesthesia Quick Evaluation

## 2013-12-30 NOTE — Op Note (Signed)
NAMEDARIS, HARKINS NO.:  [id number]  MEDICAL RECORD NO.:  [id number]  LOCATION:  5N25C                        FACILITY:  [facility]  PHYSICIAN:  Wylene Simmer, MD        DATE OF BIRTH:  April 09, 1961  DATE OF PROCEDURE:  12/30/2013 DATE OF DISCHARGE:                              OPERATIVE REPORT   PREOPERATIVE DIAGNOSIS: 1. Left ankle retained hardware, status post ankle arthrodesis. 2. Chronic draining and medial and lateral ankle wounds.  POSTOPERATIVE DIAGNOSIS: 1. Left ankle retained hardware, status post ankle arthrodesis. 2. Chronic draining and medial and lateral ankle wounds.  PROCEDURE: 1. Removal of deep implant from the distal medial tibia. 2. Removal of deep implant from the distal lateral tibia through a     separate incision. 3. AP and lateral radiographs of the left ankle. 4. Irrigation and debridement of the lateral ankle wound. 5. Irrigation and debridement of medial ankle wound with debridement     of the talus.  SURGEON:  Wylene Simmer, MD.  ASSISTANT:  Lorin Mercy, PA-C.  ANESTHESIA:  General, regional.  ESTIMATED BLOOD LOSS:  Minimal.  TOURNIQUET TIME:  48 minutes at 350 am mmHg.  SPECIMENS:  Deep tissue to Microbiology for aerobic and anaerobic culture.  COMPLICATIONS:  None apparent.  DISPOSITION:  Extubated, awake, and stable to recovery.  INDICATIONS FOR PROCEDURE:  The patient is a 53 year old male, who underwent arthrodesis of the left ankle in the late 1990s while residing in [place].  This was done for a chronic infection that occurred when he got stuck with an ice pick in his ankle.  He says that for 7 or 8 years after the surgery, he had drainage chronically.  He was never treated with long courses of antibiotics.  He says the wound finally healed up, but began draining recently.  He has also had redness around the leg. He was found to have an elevated ESR and CRP, but a normal white blood cell count.  He has retained  hardware in the ankle and presents now for removal of hardware and I and D of these 2 wounds.  He understands the risks and benefits, the alternative treatment options, and elects surgical treatment.  He specifically understands risks of bleeding, infection, nerve damage, blood clots, need for additional surgery, continued pain, amputation, and death.  PROCEDURE IN DETAIL:  After preoperative consent was obtained, the correct operative site was identified.  The patient was brought to the operating room and placed supine on the operating table.  General anesthesia was induced.  Preoperative antibiotics were held and surgical time-out was taken.  Left lower extremity was prepped and draped in standard sterile fashion with a tourniquet around the thigh.  The extremity was elevated and the tourniquet was inflated to 350 mmHg.  The screw head was identified with AP and lateral fluoroscopic images medially.  Incision was made.  Sharp dissection was carried down through the skin and subcutaneous tissue.  The screw head was completely grown over by bone.  A gouged as well as rongeur and curette were used to remove the bone over the head of the screw.  The screw was backed out  in its entirety.  Attention was then turned to the lateral aspect of the ankle where again the head of the screw was localized with AP and lateral fluoroscopic images.  A longitudinal incision was made.  Sharp dissection was carried down through the skin.  Blunt dissection was carried down through the subcutaneous tissue.  The anterolateral tibia was identified just anterior to the fibula.  Again; gouge, curettes, and rongeurs were used to remove the bone over the screw.  The screw was removed in its entirety.  Both the screw holes were then curetted and irrigated copiously.  Attention was then turned to the lateral wounds.  An ellipsoid incision was made excising the wound in its entirety.  The wound did not  tracked down to the level of bone, but ended in the subcutaneous tissue, with no evidence of any abscess.  Attention was then turned to the medial aspect of the ankle.  The wound here did probe all the way to bone.  The wound was excised in its entirety along with the tract.  That area bone was curetted and deep tissue was sent as a specimen to Microbiology for aerobic and anaerobic culture.  All wounds were then irrigated copiously with 3 L of normal saline.  Simple and horizontal mattress sutures of 3-0 nylon were used to close the skin incision.  Sterile dressings were applied followed by a compression wrap.  The tourniquet had been released at 48 minutes and antibiotics given after cultures were obtained.  FOLLOWUP PLAN:  The patient will be admitted to the inpatient service. He will start on IV vancomycin and Zosyn, and an infectious Disease consultation will be obtained.  He will stay on antibiotics awaiting the results of his cultures.  RADIOGRAPHS:  AP and lateral radiographs of the left ankle show interval removal of the 2 screws traversing the fused ankle joint.  No acute injuries noted.  Lorin Mercy, PA-C was present and scrubbed for the duration of the case.  Her assistance was essentially gaining and maintaining exposure, removing hardware, performing the debridement and closing and dressing of the wounds.     Wylene Simmer, MD     JH/MEDQ  D:  12/30/2013  T:  12/30/2013  Job:  [id number]

## 2013-12-30 NOTE — Transfer of Care (Signed)
Immediate Anesthesia Transfer of Care Note  Patient: [name]  Procedure(s) Performed: Procedure(s): LEFT ANKLE REMOVAL OF DEEP IMPLANT/LEFT IRRIGATION AND DEBRIDEMENT WOUND (Left)  Patient Location: PACU  Anesthesia Type:GA combined with regional for post-op pain  Level of Consciousness: awake, alert  and oriented  Airway & Oxygen Therapy: Patient Spontanous Breathing and Patient connected to nasal cannula oxygen  Post-op Assessment: Report given to PACU RN and Post -op Vital signs reviewed and stable  Post vital signs: Reviewed and stable  Complications: No apparent anesthesia complications

## 2013-12-30 NOTE — OR Nursing (Signed)
Hardware that was remove sent to OR processing to be decontaminated so patient could take it home with him. Patient sticker applied to bag containing hardware and note placed on bag that patient wishes to keep hardware removed from ankle. Note and sticker placed on bag by Velva Harman.

## 2013-12-30 NOTE — Progress Notes (Signed)
ANTIBIOTIC CONSULT NOTE - INITIAL  Pharmacy Consult for vancomycin/zosyn  Indication: wound infection  Allergies  Allergen Reactions  . Penicillins Other (See Comments)    Childhood reaction      Patient Measurements: Height: 5' 7.5" (171.5 cm) Weight: 278 lb 8 oz (126.327 kg) IBW/kg (Calculated) : 67.25  Vital Signs: Temp: 97.6 F (36.4 C) (07/30 1801) Temp src: Oral (07/30 1253) BP: 142/73 mmHg (07/30 1748) Pulse Rate: 60 (07/30 1800) Intake/Output from previous day:   Intake/Output from this shift:    Labs:  Recent Labs  12/28/13 0921  WBC 10.8*  HGB 14.0  PLT 233  CREATININE 0.78   Estimated Creatinine Clearance: 135.7 ml/min (by C-G formula based on Cr of 0.78). No results found for this basename: VANCOTROUGH, VANCOPEAK, VANCORANDOM, GENTTROUGH, GENTPEAK, GENTRANDOM, TOBRATROUGH, TOBRAPEAK, TOBRARND, AMIKACINPEAK, AMIKACINTROU, AMIKACIN,  in the last 72 hours   Microbiology: No results found for this or any previous visit (from the past 720 hour(s)).  Medical History: Past Medical History  Diagnosis Date  . Hypertension   . Cancer     crown of scalp, melenoma    Assessment: 29 YOM with chronic L ankle hardware drainage/infection, s/p OR for hardware removal. Pharmacy is consulted to start vancomycin and zosyn for wound infection. Noted pt. Received 1 dose of vancomycin 1g at 1600. Afebrile, wbc 10.8. Scr wnl, est. crcl > 100 ml/min. Pt. Has documented allergy to penicillin in childhood, but he doesn't remember what the reaction is. He does tolerated keflex and amoxicillin before.     Vancomycin 7/30>> Zosyn 7/30>>  7/30 tissue cx -   Goal of Therapy:  Vancomycin trough level 15-20 mcg/ml  Plan:  Vancomycin 1250 mg IV Q 12 hrs, next dose 2000 since fist dose less than required loading dose. F/u cultures and renal function Vancomycin trough at steady state. Zosyn 3.375g IV Q 8 hrs (4 hr infusion) Informed RN to watch for any allergy reactions  during first dose of zosyn.  Maryanna Shape, PharmD, BCPS  Clinical Pharmacist  Pager: [phone]   12/30/2013,7:04 PM

## 2013-12-30 NOTE — H&P (Signed)
[name] is an 53 y.o. male.   Chief Complaint:  Left ankle retained hardware and wound drainage HPI:  53 y/o male with PMH significant for left ankle arthrodesis in the remote past presents with retained hardware, chronic wound drainage from medial and lateral ankle and signs of cellulitis.  He presents now for operative treatment.  Past Medical History  Diagnosis Date  . Hypertension   . Cancer     crown of scalp, melenoma    Past Surgical History  Procedure Laterality Date  . Left ankle fusion      No family history on file. Social History:  reports that he has never smoked. He has never used smokeless tobacco. He reports that he does not drink alcohol or use illicit drugs.  Allergies:  Allergies  Allergen Reactions  . Penicillins Other (See Comments)    Childhood reaction      Medications Prior to Admission  Medication Sig Dispense Refill  . amLODipine (NORVASC) 5 MG tablet Take 5 mg by mouth daily.      Marland Kitchen gabapentin (NEURONTIN) 100 MG capsule Take 100 mg by mouth 4 (four) times daily.      Marland Kitchen ibuprofen (ADVIL,MOTRIN) 200 MG tablet Take 200 mg by mouth every 6 (six) hours as needed for moderate pain.      . meloxicam (MOBIC) 7.5 MG tablet Take 7.5 mg by mouth 2 (two) times daily.      . naproxen sodium (ANAPROX) 220 MG tablet Take 440 mg by mouth daily as needed (for pain).         No results found for this or any previous visit (from the past 48 hour(s)). No results found.  ROS  No recent f/c/n/v/wt loss  Blood pressure 168/97, pulse 77, temperature 98.2 F (36.8 C), temperature source Oral, resp. rate 18, height 5' 7.5" (1.715 m), weight 126.327 kg (278 lb 8 oz), SpO2 98.00%. Physical Exam  wn wd male in nad.  A and O x 4.  Mood and affect normal.  EOMi.  resp unlabored.  L leg with small areas of wound drainage at medial and lateral incisions.  Warmth, swelling and erythema around the foot and leg.  Chronic lymphedema changes.  Pulses are palpable.  5/5 strength  in PF and DF of the ankle.  Assessment/Plan L ankle retained hardware, chronic wound drainage and cellulitis - to OR for removal of hardware, I and D of wounds and possibly wound VAC application.  The risks and benefits of the alternative treatment options have been discussed in detail.  The patient wishes to proceed with surgery and specifically understands risks of bleeding, infection, nerve damage, blood clots, need for additional surgery, amputation and death.   Wylene Simmer 2014-01-29, 2:31 PM

## 2013-12-30 NOTE — Anesthesia Procedure Notes (Addendum)
Anesthesia Regional Block:  Popliteal block  Pre-Anesthetic Checklist: ,, timeout performed, Correct Patient, Correct Site, Correct Laterality, Correct Procedure, Correct Position, site marked, Risks and benefits discussed,  Surgical consent,  Pre-op evaluation,  At surgeon's request and post-op pain management  Laterality: Left  Prep: chloraprep       Needles:  Injection technique: Single-shot  Needle Type: Echogenic Stimulator Needle     Needle Length: 9cm 9 cm Needle Gauge: 21 and 21 G    Additional Needles:  Procedures: ultrasound guided (picture in chart) and nerve stimulator Popliteal block  Nerve Stimulator or Paresthesia:  Response: plantar flexion of foot, 0.45 mA,   Additional Responses:   Narrative:  Start time: 12/30/2013 2:23 PM End time: 12/30/2013 2:30 PM Injection made incrementally with aspirations every 5 mL.  Performed by: Personally  Anesthesiologist: Dr Marcie Bal  Additional Notes: Functioning IV was confirmed and monitors were applied.  A 35mm 21ga Arrow echogenic stimulator needle was used. Sterile prep and drape,hand hygiene and sterile gloves were used.  Negative aspiration and negative test dose prior to incremental administration of local anesthetic. The patient tolerated the procedure well.  Ultrasound guidance: relevent anatomy identified, needle position confirmed, local anesthetic spread visualized around nerve(s), vascular puncture avoided.  Image printed for medical record.    Procedure Name: Intubation Date/Time: 12/23/2013 3:10 PM Performed by: Eligha Bridegroom Pre-anesthesia Checklist: Patient identified, Timeout performed, Emergency Drugs available, Suction available and Patient being monitored Patient Re-evaluated:Patient Re-evaluated prior to inductionOxygen Delivery Method: Circle system utilized Preoxygenation: Pre-oxygenation with 100% oxygen Intubation Type: IV induction and Cricoid Pressure applied Laryngoscope Size: Mac and  4 Grade View: Grade II Tube type: Oral Tube size: 7.5 mm Number of attempts: 1 Airway Equipment and Method: Stylet Placement Confirmation: ETT inserted through vocal cords under direct vision,  breath sounds checked- equal and bilateral and positive ETCO2 Secured at: 22 cm Tube secured with: Tape Dental Injury: Teeth and Oropharynx as per pre-operative assessment

## 2013-12-30 NOTE — Anesthesia Postprocedure Evaluation (Signed)
  Anesthesia Post-op Note  Patient: [name]  Procedure(s) Performed: Procedure(s): LEFT ANKLE REMOVAL OF DEEP IMPLANT/LEFT IRRIGATION AND DEBRIDEMENT WOUND (Left)  Patient Location: PACU  Anesthesia Type:General and GA combined with regional for post-op pain  Level of Consciousness: awake, alert  and oriented  Airway and Oxygen Therapy: Patient Spontanous Breathing and Patient connected to nasal cannula oxygen  Post-op Pain: mild  Post-op Assessment: Post-op Vital signs reviewed, Patient's Cardiovascular Status Stable, Respiratory Function Stable and Pain level controlled  Post-op Vital Signs: stable  Last Vitals:  Filed Vitals:   12/30/13 1748  BP:   Pulse: 65  Temp:   Resp: 9    Complications: No apparent anesthesia complications

## 2013-12-31 DIAGNOSIS — Z872 Personal history of diseases of the skin and subcutaneous tissue: Secondary | ICD-10-CM

## 2013-12-31 DIAGNOSIS — G629 Polyneuropathy, unspecified: Secondary | ICD-10-CM | POA: Diagnosis present

## 2013-12-31 DIAGNOSIS — B999 Unspecified infectious disease: Secondary | ICD-10-CM

## 2013-12-31 DIAGNOSIS — R739 Hyperglycemia, unspecified: Secondary | ICD-10-CM | POA: Diagnosis present

## 2013-12-31 DIAGNOSIS — I10 Essential (primary) hypertension: Secondary | ICD-10-CM | POA: Diagnosis present

## 2013-12-31 DIAGNOSIS — T847XXA Infection and inflammatory reaction due to other internal orthopedic prosthetic devices, implants and grafts, initial encounter: Principal | ICD-10-CM

## 2013-12-31 DIAGNOSIS — I890 Lymphedema, not elsewhere classified: Secondary | ICD-10-CM | POA: Diagnosis present

## 2013-12-31 DIAGNOSIS — Y849 Medical procedure, unspecified as the cause of abnormal reaction of the patient, or of later complication, without mention of misadventure at the time of the procedure: Secondary | ICD-10-CM

## 2013-12-31 LAB — GLUCOSE, CAPILLARY
GLUCOSE-CAPILLARY: 81 mg/dL (ref 70–99)
GLUCOSE-CAPILLARY: 96 mg/dL (ref 70–99)
GLUCOSE-CAPILLARY: 121 mg/dL — AB (ref 70–99)
Glucose-Capillary: 94 mg/dL (ref 70–99)

## 2013-12-31 NOTE — Progress Notes (Signed)
Subjective: 1 Day Post-Op Procedure(s) (LRB): LEFT ANKLE REMOVAL OF DEEP IMPLANT/LEFT IRRIGATION AND DEBRIDEMENT WOUND (Left) Patient reports pain as moderate.  He states that he has felt well overnight and this morning. He has mild to moderate discomfort into his LLE, but it is well managed with pain medication. He denies any new concerns or issues. He denies any new HA, CP, SOB, N/V/D, fever, chills, calf pain or swelling. He has a good appetite.   Objective: Vital signs in last 24 hours: Temp:  [97.4 F (36.3 C)-98.2 F (36.8 C)] 97.8 F (36.6 C) (07/31 0536) Pulse Rate:  [60-87] 63 (07/31 0536) Resp:  [8-21] 20 (07/31 0536) BP: (123-176)/(67-135) 132/75 mmHg (07/31 0536) SpO2:  [97 %-100 %] 99 % (07/31 0536) Weight:  [126.327 kg (278 lb 8 oz)] 126.327 kg (278 lb 8 oz) (07/30 1253)  Intake/Output from previous day: 07/30 0701 - 07/31 0700 In: 1697.5 [I.V.:1397.5; IV Piggyback:300] Out: -  Intake/Output this shift:     Recent Labs  12/28/13 0921  HGB 14.0    Recent Labs  12/28/13 0921  WBC 10.8*  RBC 4.71  HCT 42.2  PLT 233    Recent Labs  12/28/13 0921  NA 143  K 3.7  CL 101  CO2 29  BUN 16  CREATININE 0.78  GLUCOSE 115*  CALCIUM 9.5   No results found for this basename: LABPT, INR,  in the last 72 hours  WE/WN caucasian male in nad, lying comfortably in bed. A and O x 4. Mood and affect are appropriate. EOMI. Respirations normal and unlabored. Soft dressings are applied to the LLE. Dressings C/D/I. NV intact.   Assessment/Plan: 1 Day Post-Op Procedure(s) (LRB): LEFT ANKLE REMOVAL OF DEEP IMPLANT/LEFT IRRIGATION AND DEBRIDEMENT WOUND (Left) Awaiting culture results, will continue ABX treatment and await ID consult. Lovenox for DVT prophylaxis.  WBAT on LLE.   Yvan Dority HOWELLS 12/31/2013, 7:40 AM

## 2013-12-31 NOTE — Consult Note (Signed)
[facility]    Date of Admission:  12/30/2013           Day 2 vancomycin        Day 2 piperacillin tazobactam       Reason for Consult: Left ankle osteomyelitis    Referring Physician: Dr. Wylene Simmer  Principal Problem:   Wound infection complicating hardware Active Problems:   History of cellulitis   Lymphedema of left leg   HTN (hypertension)   Neuropathy   Hyperglycemia   . docusate sodium  100 mg Oral BID  . enoxaparin (LOVENOX) injection  40 mg Subcutaneous Q24H  . insulin aspart  0-9 Units Subcutaneous TID WC  . piperacillin-tazobactam (ZOSYN)  IV  3.375 g Intravenous Q8H  . senna  1 tablet Oral BID  . vancomycin  1,250 mg Intravenous Q12H    Recommendations: 1. Continue current about pending culture results   Assessment: I suspect that he has some chronic, small layering ankle osteomyelitis that flared after his recent cellulitis. I will continue his current empiric antibiotic therapy and follow culture results this weekend.    HPI: [name] is a 53 y.o. male who suffered an injury to his left ankle in 1980 resulting in a foreign body he was unaware of. 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 He underwent multiple debridements and 1992 and 1993. He recalls being on long courses of IV vancomycin as well as oral cephalexin and ciprofloxacin. He had some improvement and underwent left ankle fusion in 1995. He did well in 2003 when an area on his lateral ankle began to drain intermittently. He was being followed at a [facility] in [place], [place]. He will take antibiotics intermittently with slight benefit. At the end of June he developed sudden onset of cellulitis in his left lower leg. He has had chronic lymphedema since his initial surgeries but never had cellulitis before. He was seen at a local urgent care center and started on doxycycline around June 23. He did not notice much improvement so he went to the  emergency department on July 3. He was started on oral clindamycin and begin to notice some improvement. He stayed on clindamycin until July 12. After the cellulitis resolved he had drainage from the lateral wound but also developed a wound on the medial ankle. He was seen by Dr. Doran Durand who admitted him for I and D and hardware removal yesterday. The medial ankle wound did track down this off and on which was curetted and sent for culture. Operative Gram stain did not reveal any organisms and cultures are pending.   Review of Systems: Constitutional: negative for anorexia, chills, fevers, sweats and weight loss Eyes: negative Ears, nose, mouth, throat, and face: negative Respiratory: negative Cardiovascular: negative Gastrointestinal: negative Genitourinary:negative  Past Medical History  Diagnosis Date  . Hypertension   . Cancer     crown of scalp, melenoma    History  Substance Use Topics  . Smoking status: Never Smoker   . Smokeless tobacco: Never Used  . Alcohol Use: No    History reviewed. No pertinent family history. Allergies  Allergen Reactions  . Penicillins Other (See Comments)    Childhood reaction      OBJECTIVE: Blood pressure 132/75, pulse 63, temperature 97.8 F (36.6 C), temperature source Oral, resp. rate 20, height 5' 7.5" (1.715 m), weight 126.327 kg (278 lb 8 oz), SpO2 99.00%. General: He is in  good spirits resting in bed visiting with his family Skin: No rash Lungs: Clear Cor: Regular S1-S2 no murmur Abdomen: Soft nontender Left lower leg is wrapped  Lab Results Lab Results  Component Value Date   WBC 10.8* 12/28/2013   HGB 14.0 12/28/2013   HCT 42.2 12/28/2013   MCV 89.6 12/28/2013   PLT 233 12/28/2013    Lab Results  Component Value Date   CREATININE 0.78 12/28/2013   BUN 16 12/28/2013   NA 143 12/28/2013   K 3.7 12/28/2013   CL 101 12/28/2013   CO2 29 12/28/2013    No results found for this basename: ALT, AST, GGT, ALKPHOS, BILITOT      Microbiology: Recent Results (from the past 240 hour(s))  TISSUE CULTURE     Status: None   Collection Time    12/30/13  4:13 PM      Result Value Ref Range Status   Specimen Description TISSUE ANKLE LEFT   Final   Special Requests NONE   Final   Gram Stain     Final   Value: NO WBC SEEN     NO SQUAMOUS EPITHELIAL CELLS SEEN     NO ORGANISMS SEEN     Performed at [facility]   Culture     Final   Value: NO GROWTH     Performed at [facility]   Report Status PENDING   Incomplete  ANAEROBIC CULTURE     Status: None   Collection Time    12/30/13  4:13 PM      Result Value Ref Range Status   Specimen Description TISSUE ANKLE LEFT   Final   Special Requests NONE   Final   Gram Stain     Final   Value: NO WBC SEEN     NO SQUAMOUS EPITHELIAL CELLS SEEN     NO ORGANISMS SEEN     Performed at [facility]   Culture     Final   Value: NO ANAEROBES ISOLATED; CULTURE IN PROGRESS FOR 5 DAYS     Performed at [facility]   Report Status PENDING   Incomplete    Michel Bickers, MD [facility] [phone] pager   [phone] cell 12/31/2013, 1:46 PM

## 2013-12-31 NOTE — Progress Notes (Signed)
Utilization review completed. Manasi Dishon, RN, BSN. 

## 2013-12-31 NOTE — Progress Notes (Signed)
Patient did not get out of bed on day shift today. Lots of encouragement from nursing staff to at least dangle on edge of bed and walk to the restroom was given. Patient is stating that he isn't ready to move; however, his RIGHT knee is stiff and "locking up" on him from lying in the bed. Education on the benefits of moving were given.

## 2014-01-01 LAB — GRAM STAIN

## 2014-01-01 LAB — SYNOVIAL CELL COUNT + DIFF, W/ CRYSTALS
Eosinophils-Synovial: 0 % (ref 0–1)
Lymphocytes-Synovial Fld: 3 % (ref 0–20)
Monocyte-Macrophage-Synovial Fluid: 22 % — ABNORMAL LOW (ref 50–90)
NEUTROPHIL, SYNOVIAL: 75 % — AB (ref 0–25)
WBC, Synovial: 42000 /mm3 — ABNORMAL HIGH (ref 0–200)

## 2014-01-01 LAB — GLUCOSE, CAPILLARY
GLUCOSE-CAPILLARY: 125 mg/dL — AB (ref 70–99)
GLUCOSE-CAPILLARY: 132 mg/dL — AB (ref 70–99)
GLUCOSE-CAPILLARY: 140 mg/dL — AB (ref 70–99)
Glucose-Capillary: 185 mg/dL — ABNORMAL HIGH (ref 70–99)

## 2014-01-01 MED ORDER — TRIAMCINOLONE ACETONIDE 40 MG/ML IJ SUSP
40.0000 mg | Freq: Once | INTRAMUSCULAR | Status: AC
  Administered 2014-01-01: 40 mg via INTRA_ARTICULAR
  Filled 2014-01-01: qty 1

## 2014-01-01 MED ORDER — LIDOCAINE HCL (PF) 1 % IJ SOLN
INTRAMUSCULAR | Status: AC
  Administered 2014-01-01: 5 mL
  Filled 2014-01-01: qty 15

## 2014-01-01 NOTE — Progress Notes (Signed)
Subjective: 2 Days Post-Op Procedure(s) (LRB): LEFT ANKLE REMOVAL OF DEEP IMPLANT/LEFT IRRIGATION AND DEBRIDEMENT WOUND (Left) Patient reports pain as moderate to severe.  Medications does help take the edge off. Some pain in the operative ankle, but most is in the Right knee. Onset of right knee pain was yesterday afternoon. No injury. Denies history of the this type of knee pain. Ocasional aching sensation in the past. It is excerebated by ROM of the Knee. Reports difficulty bearing weight to both LE. Denies any fever or chills. No CP, SOB, or calf pain bilateral.   Objective: Vital signs in last 24 hours: Temp:  [97.8 F (36.6 C)-98.4 F (36.9 C)] 97.8 F (36.6 C) (08/01 0553) Pulse Rate:  [82-99] 92 (08/01 0553) Resp:  [18-20] 20 (08/01 0553) BP: (143-160)/(67-85) 143/67 mmHg (08/01 0553) SpO2:  [90 %-98 %] 94 % (08/01 0553)  Intake/Output from previous day: 07/31 0701 - 08/01 0700 In: 2540 [P.O.:1440; I.V.:900; IV Piggyback:200] Out: 2900 [Urine:2900] Intake/Output this shift:    No results found for this basename: HGB,  in the last 72 hours No results found for this basename: WBC, RBC, HCT, PLT,  in the last 72 hours No results found for this basename: NA, K, CL, CO2, BUN, CREATININE, GLUCOSE, CALCIUM,  in the last 72 hours No results found for this basename: LABPT, INR,  in the last 72 hours  Alert and oriented x3. RRR, Lungs clear, BS x4. Left Calf soft and non tender. [name] ankle dressing C/D/I. No DVT signs. No signs of infection or compartment syndrome. LLE neurovascularly intact.  Right knee no effusion or ecchymosis noted. No erythema. Limited ROm and strength due to guarding and related to pain. Calf soft and non-tender. Limited exam due to pain. Compartments are soft. RLE Neurovascularly intact. No hip or ankle pain on exam.  Assessment/Plan: 2 Days Post-Op Procedure(s) (LRB): LEFT ANKLE REMOVAL OF DEEP IMPLANT/LEFT IRRIGATION AND DEBRIDEMENT WOUND (Left) Cultures  Pending, continue ABX per ID recommendations. Dressing change Sunday Continue current care  Right knee pain: Possible Gout Procedure as below Continue to monitor Monitor labs from aspiration.   Patient instruction about procedure and verbal consent obtained. No reported allergies or DM history. Right knee was identified and Time out completed.   Right knee prepped and draped in appropriate fashion.  67ml 1% lidocaine used for local anesthetic. Time allowed for medcine to take affect. Use a 18 gauge needle fluid was removed. Approxmately 24ml for  Turbid and straw colored fluid was removed. No blood or purulence noted. Fluid was sent to the Lab per the tech. For Cell count, Culture, crystals, and gram stain. 40mg  Kenolog and injected as well.  Patient tolerate dwell and dressing applied.     [name], [name] 01/01/2014, 9:36 AM

## 2014-01-01 NOTE — Progress Notes (Signed)
Patient ID: [name], male   DOB: 09/14/1960, 53 y.o.   MRN: [id number]         [facility]    Date of Admission:  12/30/2013           Day 3 vancomycin        Day 3 piperacillin tazobactam  Principal Problem:   Wound infection complicating hardware Active Problems:   History of cellulitis   Lymphedema of left leg   HTN (hypertension)   Neuropathy   Hyperglycemia   . docusate sodium  100 mg Oral BID  . enoxaparin (LOVENOX) injection  40 mg Subcutaneous Q24H  . insulin aspart  0-9 Units Subcutaneous TID WC  . piperacillin-tazobactam (ZOSYN)  IV  3.375 g Intravenous Q8H  . senna  1 tablet Oral BID  . vancomycin  1,250 mg Intravenous Q12H    Subjective: He says he is feeling better. He is having some discomfort in his left ankle postoperatively but most of his problem in the last 24 hours has been new onset of right knee pain and swelling. He is feeling better now after having 60 cc of "turbid, straw-colored fluid" drained from his knee. He has a remote history gout. He is already having problems with IV access.  Review of Systems: Pertinent items are noted in HPI.  Past Medical History  Diagnosis Date  . Hypertension   . Cancer     crown of scalp, melenoma    History  Substance Use Topics  . Smoking status: Never Smoker   . Smokeless tobacco: Never Used  . Alcohol Use: No    History reviewed. No pertinent family history.  Allergies  Allergen Reactions  . Penicillins Other (See Comments)    Childhood reaction      Objective: Temp:  [97.8 F (36.6 C)-98.4 F (36.9 C)] 97.8 F (36.6 C) (08/01 0553) Pulse Rate:  [82-99] 92 (08/01 0553) Resp:  [18-20] 20 (08/01 0553) BP: (143-160)/(67-85) 143/67 mmHg (08/01 0553) SpO2:  [90 %-98 %] 94 % (08/01 0553)  General: Is comfortable laying in bed. His wife is present.  Skin: No rash  Lungs: Clear  Cor: Regular S1-S2 no murmurs  Abdomen: Soft and nontender Joints and extremities: Right  knee swollen and slightly warm. Left lower leg wrapped  Lab Results Lab Results  Component Value Date   WBC 10.8* 12/28/2013   HGB 14.0 12/28/2013   HCT 42.2 12/28/2013   MCV 89.6 12/28/2013   PLT 233 12/28/2013    Lab Results  Component Value Date   CREATININE 0.78 12/28/2013   BUN 16 12/28/2013   NA 143 12/28/2013   K 3.7 12/28/2013   CL 101 12/28/2013   CO2 29 12/28/2013    No results found for this basename: ALT, AST, GGT, ALKPHOS, BILITOT      Microbiology: Recent Results (from the past 240 hour(s))  TISSUE CULTURE     Status: None   Collection Time    12/30/13  4:13 PM      Result Value Ref Range Status   Specimen Description TISSUE ANKLE LEFT   Final   Special Requests NONE   Final   Gram Stain     Final   Value: NO WBC SEEN     NO SQUAMOUS EPITHELIAL CELLS SEEN     NO ORGANISMS SEEN     Performed at [facility]   Culture     Final   Value: NO GROWTH  Performed at [facility]   Report Status PENDING   Incomplete  ANAEROBIC CULTURE     Status: None   Collection Time    12/30/13  4:13 PM      Result Value Ref Range Status   Specimen Description TISSUE ANKLE LEFT   Final   Special Requests NONE   Final   Gram Stain     Final   Value: NO WBC SEEN     NO SQUAMOUS EPITHELIAL CELLS SEEN     NO ORGANISMS SEEN     Performed at [facility]   Culture     Final   Value: NO ANAEROBES ISOLATED; CULTURE IN PROGRESS FOR 5 DAYS     Performed at [facility]   Report Status PENDING   Incomplete    Studies/Results: No results found.  Assessment: So far operative Gram stain and cultures from his left ankle are negative. 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 I suspect that he has superimposed gout in his right knee.  Plan: 1. Proceed with PEG placement 2. Continue current antibiotics pending final cultures  Michel Bickers,  MD [facility] for [place] [phone] pager   [phone] cell 01/01/2014, 12:24 PM

## 2014-01-02 DIAGNOSIS — M86679 Other chronic osteomyelitis, unspecified ankle and foot: Secondary | ICD-10-CM

## 2014-01-02 DIAGNOSIS — M109 Gout, unspecified: Secondary | ICD-10-CM

## 2014-01-02 LAB — BASIC METABOLIC PANEL
Anion gap: 13 (ref 5–15)
BUN: 13 mg/dL (ref 6–23)
CALCIUM: 8.9 mg/dL (ref 8.4–10.5)
CO2: 25 meq/L (ref 19–32)
Chloride: 98 mEq/L (ref 96–112)
Creatinine, Ser: 0.75 mg/dL (ref 0.50–1.35)
GFR calc Af Amer: >90 mL/min (ref >90)
GFR calc non Af Amer: >90 mL/min (ref >90)
GLUCOSE: 153 mg/dL — AB (ref 70–99)
Potassium: 3.9 mEq/L (ref 3.7–5.3)
Sodium: 136 mEq/L — ABNORMAL LOW (ref 137–147)

## 2014-01-02 LAB — VANCOMYCIN, TROUGH: Vancomycin Tr: 13 ug/mL (ref 10.0–20.0)

## 2014-01-02 LAB — GLUCOSE, CAPILLARY
GLUCOSE-CAPILLARY: 179 mg/dL — AB (ref 70–99)
GLUCOSE-CAPILLARY: 131 mg/dL — AB (ref 70–99)
Glucose-Capillary: 158 mg/dL — ABNORMAL HIGH (ref 70–99)
Glucose-Capillary: 202 mg/dL — ABNORMAL HIGH (ref 70–99)

## 2014-01-02 MED ORDER — VANCOMYCIN HCL 10 G IV SOLR
1500.0000 mg | Freq: Two times a day (BID) | INTRAVENOUS | Status: DC
  Administered 2014-01-02 – 2014-01-04 (×5): 1500 mg via INTRAVENOUS
  Filled 2014-01-02 (×9): qty 1500

## 2014-01-02 MED ORDER — SODIUM CHLORIDE 0.9 % IJ SOLN: 10.0000 mL | INTRAMUSCULAR | Status: DC | PRN

## 2014-01-02 MED ORDER — INDOMETHACIN 50 MG PO CAPS
50.0000 mg | ORAL_CAPSULE | Freq: Two times a day (BID) | ORAL | Status: DC
  Administered 2014-01-02 – 2014-01-04 (×5): 50 mg via ORAL
  Filled 2014-01-02 (×7): qty 1

## 2014-01-02 NOTE — Progress Notes (Signed)
Peripherally Inserted Central Catheter/Midline Placement  The IV Nurse has discussed with the patient and/or persons authorized to consent for the patient, the purpose of this procedure and the potential benefits and risks involved with this procedure.  The benefits include less needle sticks, lab draws from the catheter and patient may be discharged home with the catheter.  Risks include, but not limited to, infection, bleeding, blood clot (thrombus formation), and puncture of an artery; nerve damage and irregular heat beat.  Alternatives to this procedure were also discussed.  PICC/Midline Placement Documentation  PICC / Midline Single Lumen 01/02/14 PICC Right Brachial 43 cm 0 cm (Active)  Indication for Insertion or Continuance of Line Home intravenous therapies (PICC only) 01/02/2014  9:04 AM  Exposed Catheter (cm) 0 cm 01/02/2014  9:04 AM  Site Assessment Clean;Dry;Intact 01/02/2014  9:04 AM  Line Status Flushed;Saline locked;Blood return noted 01/02/2014  9:04 AM  Dressing Type Transparent 01/02/2014  9:04 AM  Dressing Status Clean;Dry;Intact;Antimicrobial disc in place 01/02/2014  9:04 AM  Line Care Connections checked and tightened 01/02/2014  9:04 AM  Line Adjustment (NICU/IV Team Only) No 01/02/2014  9:04 AM  Dressing Intervention New dressing 01/02/2014  9:04 AM  Dressing Change Due 01/09/14 01/02/2014  9:04 AM       Rolena Infante 01/02/2014, 9:05 AM

## 2014-01-02 NOTE — Progress Notes (Signed)
Patient ID: [name], male   DOB: 1960-07-27, 53 y.o.   MRN: [id number]         [facility] for Infectious Disease    Date of Admission:  12/30/2013   Total days of antibiotics 4         Principal Problem:   Wound infection complicating hardware Active Problems:   History of cellulitis   Lymphedema of left leg   HTN (hypertension)   Neuropathy   Hyperglycemia   . docusate sodium  100 mg Oral BID  . enoxaparin (LOVENOX) injection  40 mg Subcutaneous Q24H  . indomethacin  50 mg Oral BID WC  . insulin aspart  0-9 Units Subcutaneous TID WC  . piperacillin-tazobactam (ZOSYN)  IV  3.375 g Intravenous Q8H  . senna  1 tablet Oral BID  . vancomycin  1,500 mg Intravenous Q12H    Subjective: He is feeling much better today. He is having much less pain in his right knee after a steroid injection yesterday. He has a moderate amount of postoperative pain in his left ankle.   Past Medical History  Diagnosis Date  . Hypertension   . Cancer     crown of scalp, melenoma    History  Substance Use Topics  . Smoking status: Never Smoker   . Smokeless tobacco: Never Used  . Alcohol Use: No    History reviewed. No pertinent family history.  Allergies  Allergen Reactions  . Penicillins Other (See Comments)    Childhood reaction      Objective: Temp:  [98.2 F (36.8 C)-99 F (37.2 C)] 98.2 F (36.8 C) (08/02 0603) Pulse Rate:  [71-81] 71 (08/02 0603) Resp:  [18-20] 18 (08/02 0603) BP: (128-140)/(61-77) 128/61 mmHg (08/02 0603) SpO2:  [95 %-96 %] 96 % (08/02 0603)  General: He is sitting on the side of the bed eating lunch and visiting with family Skin: New right arm PICC Joints and extremities: Right knee was swollen. Left foot with Ace wrap  Lab Results Lab Results  Component Value Date   WBC 10.8* 12/28/2013   HGB 14.0 12/28/2013   HCT 42.2 12/28/2013   MCV 89.6 12/28/2013   PLT 233 12/28/2013    Lab Results  Component Value Date   CREATININE 0.75 01/02/2014   BUN 13 01/02/2014   NA 136* 01/02/2014   K 3.9 01/02/2014   CL 98 01/02/2014   CO2 25 01/02/2014    No results found for this basename: ALT, AST, GGT, ALKPHOS, BILITOT    Right knee synovial fluid positive for monosodium urate crystals   Microbiology: Recent Results (from the past 240 hour(s))  TISSUE CULTURE     Status: None   Collection Time    12/30/13  4:13 PM      Result Value Ref Range Status   Specimen Description TISSUE ANKLE LEFT   Final   Special Requests NONE   Final   Gram Stain     Final   Value: NO WBC SEEN     NO SQUAMOUS EPITHELIAL CELLS SEEN     NO ORGANISMS SEEN     Performed at [facility]   Culture     Final   Value: NO GROWTH 2 DAYS     Performed at [facility]   Report Status PENDING   Incomplete  ANAEROBIC CULTURE     Status: None   Collection Time    12/30/13  4:13 PM      Result Value Ref Range  Status   Specimen Description TISSUE ANKLE LEFT   Final   Special Requests NONE   Final   Gram Stain     Final   Value: NO WBC SEEN     NO SQUAMOUS EPITHELIAL CELLS SEEN     NO ORGANISMS SEEN     Performed at [facility]   Culture     Final   Value: NO ANAEROBES ISOLATED; CULTURE IN PROGRESS FOR 5 DAYS     Performed at [facility]   Report Status PENDING   Incomplete  GRAM STAIN     Status: None   Collection Time    01/01/14 11:14 AM      Result Value Ref Range Status   Specimen Description SYNOVIAL RIGHT KNEE   Final   Special Requests NONE   Final   Gram Stain     Final   Value: WBC PRESENT, PREDOMINANTLY PMN     NO ORGANISMS SEEN     CYTOSPIN SLIDE   Report Status 01/01/2014 FINAL   Final  BODY FLUID CULTURE     Status: None   Collection Time    01/01/14 11:14 AM      Result Value Ref Range Status   Specimen Description SYNOVIAL RIGHT KNEE   Final   Special Requests NONE   Final   Gram Stain     Final   Value: ABUNDANT WBC PRESENT, PREDOMINANTLY PMN     NO ORGANISMS SEEN     Performed at [facility]    Culture     Final   Value: NO GROWTH 1 DAY     Performed at [facility]   Report Status PENDING   Incomplete    Studies/Results: No results found.  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 Left ankle cultures remain negative. He recalls that MRSA is the only pathogen ever isolated from his left ankle and that he was always treated with IV vancomycin plus minus gentamicin when first diagnosed years ago. He has a history of Pseudomonas infection in his right ankle but never recalls that being isolated from his left ankle. If his operative cultures remain negative I would recommend that he go home on IV vancomycin alone.  Plan: 1. Continue vancomycin 2. Discontinue Zosyn 3. Check final cultures 4. Arrange outpatient IV antibiotics  Michel Bickers, MD [facility] for [facility] [phone] pager   [phone] cell 01/02/2014, 2:38 PM

## 2014-01-02 NOTE — Progress Notes (Signed)
   Subjective: 3 Days Post-Op Procedure(s) (LRB): LEFT ANKLE REMOVAL OF DEEP IMPLANT/LEFT IRRIGATION AND DEBRIDEMENT WOUND (Left) Patient reports pain as mild.   Patient seen in rounds with Dr. Gladstone Lighter. Patient is feeling better this morning. Reports that his right knee has felt better since aspiration and injection of cortisone yesterday.  Reports no history of gout. Reports that he is doing well other than getting little rest due to multiple interruptions from staff. No issues overnight. No SOB or chest pain. No calf pain.  Plan is to go Home after hospital stay.  Objective: Vital signs in last 24 hours: Temp:  [98.2 F (36.8 C)-99 F (37.2 C)] 98.2 F (36.8 C) (08/02 0603) Pulse Rate:  [71-82] 71 (08/02 0603) Resp:  [18-20] 18 (08/02 0603) BP: (128-140)/(61-80) 128/61 mmHg (08/02 0603) SpO2:  [95 %-97 %] 96 % (08/02 0603)  Intake/Output from previous day:  Intake/Output Summary (Last 24 hours) at 01/02/14 0748 Last data filed at 01/02/14 0605  Gross per 24 hour  Intake    960 ml  Output    350 ml  Net    610 ml     Recent Labs  01/02/14 0649  NA 136*  K 3.9  CL 98  CO2 25  BUN 13  CREATININE 0.75  GLUCOSE 153*  CALCIUM 8.9    EXAM General - Patient is Alert and Oriented Extremity - Neurologically intact Neurovascular intact No cellulitis present Compartment soft Dressing/Incision - clean, dry, no drainage  Past Medical History  Diagnosis Date  . Hypertension   . Cancer     crown of scalp, melenoma    Assessment/Plan: 3 Days Post-Op Procedure(s) (LRB): LEFT ANKLE REMOVAL OF DEEP IMPLANT/LEFT IRRIGATION AND DEBRIDEMENT WOUND (Left) Principal Problem:   Wound infection complicating hardware Active Problems:   History of cellulitis   Lymphedema of left leg   HTN (hypertension)   Neuropathy   Hyperglycemia  Estimated body mass index is 42.95 kg/(m^2) as calculated from the following:   Height as of this encounter: 5' 7.5" (1.715 m).   Weight as of  this encounter: 126.327 kg (278 lb 8 oz).   DVT Prophylaxis - Lovenox Weight-Bearing as tolerated   Patient is doing better. Labs from aspiration of right knee yesterday indicate gout. Will start on Indocin for gout in right knee. Continue abx therapy per ID recommendations.   Ardeen Jourdain, PA-C Orthopaedic Surgery 01/02/2014, 7:48 AM

## 2014-01-02 NOTE — Progress Notes (Signed)
ANTIBIOTIC CONSULT NOTE - FOLLOW UP  Pharmacy Consult for Vancomycin and Zosyn Indication: Osteo of the L ankle  Allergies  Allergen Reactions  . Penicillins Other (See Comments)    Childhood reaction     Patient Measurements: Height: 5' 7.5" (171.5 cm) Weight: 278 lb 8 oz (126.327 kg) IBW/kg (Calculated) : 67.25  Vital Signs: Temp: 98.2 F (36.8 C) (08/02 0603) Temp src: Oral (08/02 0603) BP: 128/61 mmHg (08/02 0603) Pulse Rate: 71 (08/02 0603) Intake/Output from previous day: 08/01 0701 - 08/02 0700 In: 960 [P.O.:960] Out: 350 [Urine:350] Intake/Output from this shift:   Labs:  Recent Labs  01/02/14 0649  CREATININE 0.75   Estimated Creatinine Clearance: 135.7 ml/min (by C-G formula based on Cr of 0.75).  Recent Labs  01/02/14 0649  VANCOTROUGH 13.0     Microbiology: Recent Results (from the past 720 hour(s))  TISSUE CULTURE     Status: None   Collection Time    12/30/13  4:13 PM      Result Value Ref Range Status   Specimen Description TISSUE ANKLE LEFT   Final   Special Requests NONE   Final   Gram Stain     Final   Value: NO WBC SEEN     NO SQUAMOUS EPITHELIAL CELLS SEEN     NO ORGANISMS SEEN     Performed at [facility]   Culture     Final   Value: NO GROWTH 2 DAYS     Performed at [facility]   Report Status PENDING   Incomplete  ANAEROBIC CULTURE     Status: None   Collection Time    12/30/13  4:13 PM      Result Value Ref Range Status   Specimen Description TISSUE ANKLE LEFT   Final   Special Requests NONE   Final   Gram Stain     Final   Value: NO WBC SEEN     NO SQUAMOUS EPITHELIAL CELLS SEEN     NO ORGANISMS SEEN     Performed at [facility]   Culture     Final   Value: NO ANAEROBES ISOLATED; CULTURE IN PROGRESS FOR 5 DAYS     Performed at [facility]   Report Status PENDING   Incomplete  GRAM STAIN     Status: None   Collection Time    01/01/14 11:14 AM      Result Value Ref Range Status   Specimen Description SYNOVIAL RIGHT KNEE   Final   Special Requests NONE   Final   Gram Stain     Final   Value: WBC PRESENT, PREDOMINANTLY PMN     NO ORGANISMS SEEN     CYTOSPIN SLIDE   Report Status 01/01/2014 FINAL   Final  BODY FLUID CULTURE     Status: None   Collection Time    01/01/14 11:14 AM      Result Value Ref Range Status   Specimen Description SYNOVIAL RIGHT KNEE   Final   Special Requests NONE   Final   Gram Stain     Final   Value: ABUNDANT WBC PRESENT, PREDOMINANTLY PMN     NO ORGANISMS SEEN     Performed at [facility]   Culture PENDING   Incomplete   Report Status PENDING   Incomplete    Anti-infectives   Start     Dose/Rate Route Frequency Ordered Stop   12/30/13 2000  vancomycin (VANCOCIN) 1,250 mg  in sodium chloride 0.9 % 250 mL IVPB     1,250 mg 166.7 mL/hr over 90 Minutes Intravenous Every 12 hours 12/30/13 1923     12/30/13 2000  piperacillin-tazobactam (ZOSYN) IVPB 3.375 g     3.375 g 12.5 mL/hr over 240 Minutes Intravenous Every 8 hours 12/30/13 1923     12/30/13 1530  vancomycin (VANCOCIN) IVPB 1000 mg/200 mL premix     1,000 mg 200 mL/hr over 60 Minutes Intravenous  Once 12/30/13 1518 12/30/13 1630     Assessment: 26 YOM with chronic L ankle hardware drainage/infection, s/p OR for hardware removal. Pharmacy is consulted to start vancomycin and zosyn for wound infection. Afebrile, wbc 10.8. Scr wnl, est. crcl > 100 ml/min. Pt. Has documented allergy to penicillin in childhood, but he doesn't remember what the reaction is. He has tolerated keflex and amoxicillin before. Cx are still ngtd. 8/2 VT came back subtherapeutic at 13 on Vanc 1.25g Q12h.  Goal of Therapy:  Vancomycin trough level 15-20 mcg/ml  Plan:  Change Vancomycin to 1500 mg IV Q 12 hrs Zosyn 3.375g IV Q 8 hrs (4 hr infusion) F/u cx and renal function  Annabeth Tortora J 01/02/2014,7:41 AM

## 2014-01-03 ENCOUNTER — Encounter (HOSPITAL_COMMUNITY): Payer: Self-pay | Admitting: Orthopedic Surgery

## 2014-01-03 LAB — GLUCOSE, CAPILLARY
GLUCOSE-CAPILLARY: 234 mg/dL — AB (ref 70–99)
GLUCOSE-CAPILLARY: 151 mg/dL — AB (ref 70–99)
Glucose-Capillary: 135 mg/dL — ABNORMAL HIGH (ref 70–99)
Glucose-Capillary: 128 mg/dL — ABNORMAL HIGH (ref 70–99)

## 2014-01-03 LAB — TISSUE CULTURE
Culture: NO GROWTH
Gram Stain: NONE SEEN

## 2014-01-03 MED ORDER — HYDROCODONE-ACETAMINOPHEN 5-325 MG PO TABS: 1.0000 | ORAL_TABLET | Freq: Four times a day (QID) | ORAL | Status: DC | PRN

## 2014-01-03 MED ORDER — VANCOMYCIN HCL 10 G IV SOLR: 1500.0000 mg | Freq: Two times a day (BID) | INTRAVENOUS | Status: DC

## 2014-01-03 NOTE — Discharge Instructions (Signed)
[name] Simmer, MD [place]  Please read the following information regarding your care after surgery.  Medications  You only need a prescription for the narcotic pain medicine (ex. oxycodone, Percocet, Norco).  All of the other medicines listed below are available over the counter. ? acetominophen (Tylenol) 650 mg every 4-6 hours as you need for minor pain   Narcotic pain medicine (ex. oxycodone, Percocet, Vicodin) will cause constipation.  To prevent this problem, take the following medicines while you are taking any pain medicine. X docusate sodium (Colace) 100 mg twice a day Xsenna (Senokot) 2 tablets twice a day    Weight Bearing X Bear weight when you are able on your operated leg or foot.  Cast / Splint / Dressing XRemove your dressing 3 days after surgery and cover the incisions with dry dressings.    After your dressing, cast or splint is removed; you may shower, but do not soak or scrub the wound.  Allow the water to run over it, and then gently pat it dry.  Swelling It is normal for you to have swelling where you had surgery.  To reduce swelling and pain, keep your toes above your nose for at least 3 days after surgery.  It may be necessary to keep your foot or leg elevated for several weeks.  If it hurts, it should be elevated.  Follow Up Call my office at [phone] when you are discharged from the hospital or surgery center to schedule an appointment to be seen two weeks after surgery.  Call my office at [phone] if you develop a fever >101.5 F, nausea, vomiting, bleeding from the surgical site or severe pain.

## 2014-01-03 NOTE — Discharge Summary (Signed)
Physician Discharge Summary  Patient ID: [name] MRN: [id number] DOB/AGE: Oct 25, 1960 53 y.o.  Admit date: 12/30/2013 Discharge date: 01/03/2014  Admission Diagnoses: L ankle wound infection complicating hardware. Hyperglycemia. Lymphedema of L leg.   Discharge Diagnoses:  Principal Problem:   Wound infection complicating hardware Active Problems:   History of cellulitis   Lymphedema of left leg   HTN (hypertension)   Neuropathy   Hyperglycemia   Discharged Condition: good  Hospital Course: [name] presented to [facility] on 12/30/13 for hardware removal from his L ankle, wound exploration and tissue/wound cultures. He tolerated the procedure well and had no post-operative complications on the day of surgery or the following day. He was started on Vanc and Zosyn in the immediate post-operative period. On 01/01/14, POD day 2, [name] developed a painful effusion of his R knee which was aspirated and injected with kenalog; fluid was sent for cultures and gram stain. He was treated for gout after fluid analysis and the pain in his R knee has much improved. He had a PICC line placed into his R arm on 01/02/14 to continue outpatient IV vanc tx per ID recs. He has done well since his initial admission.  Today, 01/04/14, he states that he is feeling better and that the pain in his L ankle is controlled fairly well with his pain medications. He also states that the pain in his knee has improved as well. He denies any new HA, SOB, CP, N/V/D, fever, chills, calf pain or swelling. He has a good appetite, has appropriate bowel and bladder function, and is eager to go home.  He has been up as tolerated and walking on his L leg.   Consults: ID  Significant Diagnostic Studies: microbiology: wound culture: awaiting final 5 day culture results  Treatments: antibiotics: vancomycin 1.5g BID  Discharge Exam: Blood pressure 137/63, pulse 53, temperature 98.4 F (36.9 C), temperature source Oral, resp. rate 20,  height 5' 7.5" (1.715 m), weight 126.327 kg (278 lb 8 oz), SpO2 100.00%.  Physical Exam: WD/WN male in nad. He lays comfortably in bed. A and O x4. Mood and affect are appropriate. EOMI. Respirations normal and unlabored. Soft dressing applied to LLE, C/D/I. R knee without effusion present. No erythema or warmth or R knee. No lymphadenopathy. No calf swelling or palpable cords.  Disposition: 01-Home or Self Care     Medication List    ASK your doctor about these medications       amLODipine 5 MG tablet  Commonly known as:  NORVASC  Take 5 mg by mouth daily.     gabapentin 100 MG capsule  Commonly known as:  NEURONTIN  Take 100 mg by mouth 4 (four) times daily.     ibuprofen 200 MG tablet  Commonly known as:  ADVIL,MOTRIN  Take 200 mg by mouth every 6 (six) hours as needed for moderate pain.     meloxicam 7.5 MG tablet  Commonly known as:  MOBIC  Take 7.5 mg by mouth 2 (two) times daily.     naproxen sodium 220 MG tablet  Commonly known as:  ANAPROX  Take 440 mg by mouth daily as needed (for pain).       -Will follow final ABX recs per ID when final cultures are resulted. PICC line in place. He has a home Rx for Vanc 1.5g BID x 6 weeks. -Continue Indocin tx for R knee -WBAT on LLE    Signed: Janaiah Vetrano [name] 01/03/2014, 7:16 AM

## 2014-01-03 NOTE — Progress Notes (Signed)
Patient ID: [name], male   DOB: 1960-07-28, 53 y.o.   MRN: [id number]         [facility] for Infectious Disease    Date of Admission:  12/30/2013   Total days of antibiotics 5         Principal Problem:   Wound infection complicating hardware Active Problems:   History of cellulitis   Lymphedema of left leg   HTN (hypertension)   Neuropathy   Hyperglycemia   . docusate sodium  100 mg Oral BID  . enoxaparin (LOVENOX) injection  40 mg Subcutaneous Q24H  . indomethacin  50 mg Oral BID WC  . insulin aspart  0-9 Units Subcutaneous TID WC  . senna  1 tablet Oral BID  . vancomycin  1,500 mg Intravenous Q12H    Subjective: He is feeling better today. He is having much less pain in his right knee. He has a moderate amount of postoperative pain in his left ankle.   Past Medical History  Diagnosis Date  . Hypertension   . Cancer     crown of scalp, melenoma    History  Substance Use Topics  . Smoking status: Never Smoker   . Smokeless tobacco: Never Used  . Alcohol Use: No    History reviewed. No pertinent family history.  Allergies  Allergen Reactions  . Penicillins Other (See Comments)    Childhood reaction      Objective: Temp:  [97.5 F (36.4 C)-98.4 F (36.9 C)] 97.9 F (36.6 C) (08/03 1400) Pulse Rate:  [53-73] 73 (08/03 1400) Resp:  [18-20] 18 (08/03 1400) BP: (137-168)/(63-82) 168/81 mmHg (08/03 1400) SpO2:  [94 %-100 %] 99 % (08/03 1400)  General: He is comfortable in bed Skin: Right arm PICC site appears normal Joints and extremities: Right knee less swollen. Left foot with Ace wrap  Lab Results Lab Results  Component Value Date   WBC 10.8* 12/28/2013   HGB 14.0 12/28/2013   HCT 42.2 12/28/2013   MCV 89.6 12/28/2013   PLT 233 12/28/2013    Lab Results  Component Value Date   CREATININE 0.75 01/02/2014   BUN 13 01/02/2014   NA 136* 01/02/2014   K 3.9 01/02/2014   CL 98 01/02/2014   CO2 25 01/02/2014    No results found for this basename: ALT,   AST,  GGT,  ALKPHOS,  BILITOT    Right knee synovial fluid positive for monosodium urate crystals   Microbiology: Recent Results (from the past 240 hour(s))  TISSUE CULTURE     Status: None   Collection Time    12/30/13  4:13 PM      Result Value Ref Range Status   Specimen Description TISSUE ANKLE LEFT   Final   Special Requests NONE   Final   Gram Stain     Final   Value: NO WBC SEEN     NO SQUAMOUS EPITHELIAL CELLS SEEN     NO ORGANISMS SEEN     Performed at [facility]   Culture     Final   Value: NO GROWTH 3 DAYS     Performed at [facility]   Report Status 01/03/2014 FINAL   Final  ANAEROBIC CULTURE     Status: None   Collection Time    12/30/13  4:13 PM      Result Value Ref Range Status   Specimen Description TISSUE ANKLE LEFT   Final   Special Requests NONE   Final  Gram Stain     Final   Value: NO WBC SEEN     NO SQUAMOUS EPITHELIAL CELLS SEEN     NO ORGANISMS SEEN     Performed at [facility]   Culture     Final   Value: NO ANAEROBES ISOLATED; CULTURE IN PROGRESS FOR 5 DAYS     Performed at [facility]   Report Status PENDING   Incomplete  GRAM STAIN     Status: None   Collection Time    01/01/14 11:14 AM      Result Value Ref Range Status   Specimen Description SYNOVIAL RIGHT KNEE   Final   Special Requests NONE   Final   Gram Stain     Final   Value: WBC PRESENT, PREDOMINANTLY PMN     NO ORGANISMS SEEN     CYTOSPIN SLIDE   Report Status 01/01/2014 FINAL   Final  BODY FLUID CULTURE     Status: None   Collection Time    01/01/14 11:14 AM      Result Value Ref Range Status   Specimen Description SYNOVIAL RIGHT KNEE   Final   Special Requests NONE   Final   Gram Stain     Final   Value: ABUNDANT WBC PRESENT, PREDOMINANTLY PMN     NO ORGANISMS SEEN     Performed at [facility]   Culture     Final   Value: NO GROWTH 2 DAYS     Performed at [facility]   Report Status PENDING   Incomplete     Studies/Results: No results found.  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  Plan: 1. Continue vancomycin 6 weeks total through September 9 2. Check final cultures 3. I will arrange followup in our clinic within the next few weeks  Michel Bickers, MD [facility] for [place] [phone] pager   [phone] cell 01/03/2014, 4:48 PM

## 2014-01-03 NOTE — Care Management Note (Signed)
CARE MANAGEMENT NOTE 01/03/2014  Patient:  [name],[name]   Account Number:  [id number]  Date Initiated:  01/03/2014  Documentation initiated by:  Ricki Miller  Subjective/Objective Assessment:   53 yr old male admitted with cellulitis and lymphedema of left leg/ankle. S/p I & D and hardware removal.     Action/Plan:   Case manager spoke with patient concerning home health needs at discharge. Choice offered. Referral called to [place] liaison, [facility].   Anticipated DC Date:  01/03/2014   Anticipated DC Plan:  [place]  CM consult      [facility] Choice  HOME HEALTH   Choice offered to / List presented to:  C-1 Patient        [place] arranged  HH-1 RN  IV Antibiotics      [place].   Status of service:  Completed, signed off Medicare Important Message given?   (If response is "NO", the following Medicare IM given date fields will be blank) Date Medicare IM given:   Medicare IM given by:   Date Additional Medicare IM given:   Additional Medicare IM given by:    Discharge Disposition:  [place]  Per UR Regulation:  Reviewed for med. necessity/level of care/duration of stay

## 2014-01-04 LAB — BODY FLUID CULTURE: Culture: NO GROWTH

## 2014-01-04 LAB — ANAEROBIC CULTURE: Gram Stain: NONE SEEN

## 2014-01-04 LAB — GLUCOSE, CAPILLARY
Glucose-Capillary: 117 mg/dL — ABNORMAL HIGH (ref 70–99)
Glucose-Capillary: 122 mg/dL — ABNORMAL HIGH (ref 70–99)

## 2014-01-04 MED ORDER — HEPARIN SOD (PORK) LOCK FLUSH 100 UNIT/ML IV SOLN: 250.0000 [IU] | INTRAVENOUS | Status: DC | PRN

## 2014-01-22 ENCOUNTER — Encounter (HOSPITAL_COMMUNITY): Payer: Self-pay | Admitting: Emergency Medicine

## 2014-01-22 ENCOUNTER — Emergency Department (HOSPITAL_COMMUNITY)
Admission: EM | Admit: 2014-01-22 | Discharge: 2014-01-23 | Disposition: A | Discharge status: Home / Self Care | Payer: 59 | Attending: Emergency Medicine | Admitting: Emergency Medicine

## 2014-01-22 DIAGNOSIS — Z792 Long term (current) use of antibiotics: Secondary | ICD-10-CM | POA: Diagnosis not present

## 2014-01-22 DIAGNOSIS — Z880 Allergy status to penicillin: Secondary | ICD-10-CM | POA: Diagnosis not present

## 2014-01-22 DIAGNOSIS — I10 Essential (primary) hypertension: Secondary | ICD-10-CM | POA: Diagnosis not present

## 2014-01-22 DIAGNOSIS — Y838 Other surgical procedures as the cause of abnormal reaction of the patient, or of later complication, without mention of misadventure at the time of the procedure: Secondary | ICD-10-CM | POA: Insufficient documentation

## 2014-01-22 DIAGNOSIS — Z85820 Personal history of malignant melanoma of skin: Secondary | ICD-10-CM | POA: Insufficient documentation

## 2014-01-22 DIAGNOSIS — T82598A Other mechanical complication of other cardiac and vascular devices and implants, initial encounter: Secondary | ICD-10-CM | POA: Insufficient documentation

## 2014-01-22 DIAGNOSIS — Z79899 Other long term (current) drug therapy: Secondary | ICD-10-CM | POA: Diagnosis not present

## 2014-01-22 DIAGNOSIS — Z791 Long term (current) use of non-steroidal anti-inflammatories (NSAID): Secondary | ICD-10-CM | POA: Insufficient documentation

## 2014-01-22 DIAGNOSIS — Z452 Encounter for adjustment and management of vascular access device: Secondary | ICD-10-CM

## 2014-01-22 NOTE — ED Provider Notes (Signed)
CSN: [id number]     Arrival date & time 01/22/14  1928 History   First MD Initiated Contact with Patient 01/22/14 2145     Chief Complaint  Patient presents with  . Vascular Access Problem     (Consider location/radiation/quality/duration/timing/severity/associated sxs/prior Treatment) HPI 53 year old with right arm PICC for 6 weeks of IV vancomycin after having infected hardware in the left ankle removed, has had vancomycin for about 4 weeks, home health nurse was unable to aspirate or flush PICC this morning and patient came to the ED this evening for evaluation. Patient has no redness or discharge and his PICC line insertion site It has no swelling or pain to his right arm is no weakness or numbness or color change to his right arm is no chest pain no shortness of breath no cough, no fever and no other concerns. Past Medical History  Diagnosis Date  . Hypertension   . Cancer     crown of scalp, melenoma   Past Surgical History  Procedure Laterality Date  . Left ankle fusion    . Hardware removal Left 12/30/2013    Procedure: LEFT ANKLE REMOVAL OF DEEP IMPLANT/LEFT IRRIGATION AND DEBRIDEMENT WOUND;  Surgeon: Wylene Simmer, MD;  Location: [place];  Service: Orthopedics;  Laterality: Left;   History reviewed. No pertinent family history. History  Substance Use Topics  . Smoking status: Never Smoker   . Smokeless tobacco: Never Used  . Alcohol Use: No    Review of Systems  10 Systems reviewed and are negative for acute change except as noted in the HPI.  Allergies  Penicillins  Home Medications   Prior to Admission medications   Medication Sig Start Date End Date Taking? Authorizing Provider  amLODipine (NORVASC) 5 MG tablet Take 5 mg by mouth daily.   Yes Historical Provider, MD  gabapentin (NEURONTIN) 100 MG capsule Take 100 mg by mouth 4 (four) times daily.   Yes Historical Provider, MD  hydrochlorothiazide (HYDRODIURIL) 25 MG tablet Take 25 mg by mouth daily. 01/14/14  Yes  Historical Provider, MD  meloxicam (MOBIC) 7.5 MG tablet Take 7.5 mg by mouth 2 (two) times daily.   Yes Historical Provider, MD  traMADol (ULTRAM) 50 MG tablet Take 50 mg by mouth every 6 (six) hours as needed (for pain).  01/20/14  Yes Historical Provider, MD  vancomycin 1,500 mg in sodium chloride 0.9 % 500 mL Inject 1,500 mg into the vein every 12 (twelve) hours. 01/03/14  Yes [place], [place]   BP 155/90  Pulse 100  Temp(Src) 98.7 F (37.1 C) (Oral)  Resp 18  SpO2 98% Physical Exam  Nursing note and vitals reviewed. Constitutional:  Awake, alert, nontoxic appearance.  HENT:  Head: Atraumatic.  Eyes: Right eye exhibits no discharge. Left eye exhibits no discharge.  Neck: Neck supple.  Cardiovascular: Normal rate and regular rhythm.   No murmur heard. Pulmonary/Chest: Effort normal and breath sounds normal. No respiratory distress. He has no wheezes. He has no rales. He exhibits no tenderness.  Abdominal: Soft. Bowel sounds are normal. He exhibits no distension. There is no tenderness. There is no rebound and no guarding.  Musculoskeletal: He exhibits no tenderness.  Baseline ROM, no obvious new focal weakness. Right arm PICC line insertion site clean with no erythema no fluctuance no purulent drainage; right arm is nontender without edema with radial pulse intact capillary refill less than 2 seconds normal light touch good movement right hand without tenderness.  Neurological: He is alert.  Mental status and motor strength appears baseline for patient and situation.  Skin: No rash noted.  Psychiatric: He has a normal mood and affect.    ED Course  Procedures (including critical care time) IV therapy nurse was able to restore function in patients decline without need for TPA infusion. Patient informed of clinical course, understand medical decision-making process, and agree with plan. Labs Review Labs Reviewed - No data to display  Imaging Review No results  found.   EKG Interpretation None      MDM   Final diagnoses:  PICC (peripherally inserted central catheter) flush    I doubt any other EMC precluding discharge at this time including, but not necessarily limited to the following: DVT.    Babette Relic, MD 02/01/14 2012

## 2014-01-22 NOTE — ED Notes (Signed)
IV team at bedside 

## 2014-01-22 NOTE — ED Notes (Signed)
Pt is on IV Vanc at home.  He has a home health RN that came to the house this AM and was unable to flush or draw back blood from his PICC line.

## 2014-01-22 NOTE — Discharge Instructions (Signed)
Return sooner to the emergency department if you develop fever, right arm pain or swelling, right arm color change weakness or numbness, redness or pus drainage from PICC line, recurrent PICC line malfunction, or other concerns.

## 2014-01-26 ENCOUNTER — Encounter: Payer: Self-pay | Admitting: Internal Medicine

## 2014-01-26 ENCOUNTER — Ambulatory Visit (INDEPENDENT_AMBULATORY_CARE_PROVIDER_SITE_OTHER): Payer: 59 | Admitting: Internal Medicine

## 2014-01-26 VITALS — BP 180/120 | HR 102 | Temp 98.3°F | Ht 68.0 in | Wt 279.0 lb

## 2014-01-26 DIAGNOSIS — Z0000 Encounter for general adult medical examination without abnormal findings: Secondary | ICD-10-CM

## 2014-01-26 NOTE — Progress Notes (Signed)
Patient ID: [name], male   DOB: [birth year], 53 y.o.   MRN: [id number]         [facility] for Infectious Disease  Patient Active Problem List   Diagnosis Date Noted  . History of cellulitis 12/31/2013  . Lymphedema of left leg 12/31/2013  . HTN (hypertension) 12/31/2013  . Neuropathy 12/31/2013  . Hyperglycemia 12/31/2013  . Wound infection complicating hardware 60/63/0160    Patient's Medications  New Prescriptions   No medications on file  Previous Medications   AMLODIPINE (NORVASC) 5 MG TABLET    Take 5 mg by mouth daily.   GABAPENTIN (NEURONTIN) 100 MG CAPSULE    Take 100 mg by mouth 4 (four) times daily.   HYDROCHLOROTHIAZIDE (HYDRODIURIL) 25 MG TABLET    Take 25 mg by mouth daily.   MELOXICAM (MOBIC) 7.5 MG TABLET    Take 7.5 mg by mouth 2 (two) times daily.   TRAMADOL (ULTRAM) 50 MG TABLET    Take 50 mg by mouth every 6 (six) hours as needed (for pain).    VANCOMYCIN 1,500 MG IN SODIUM CHLORIDE 0.9 % 500 ML    Inject 1,500 mg into the vein every 12 (twelve) hours.  Modified Medications   No medications on file  Discontinued Medications   No medications on file    Subjective: [name] is in for his hospital followup visit. He is a 53 y.o. male who suffered an injury to his left ankle in 1980 resulting in a foreign body he was unaware of. He developed infection he recalls being MRSA. He underwent multiple debridements and 1992 and 1993. He recalls being on long courses of IV vancomycin as well as oral cephalexin and ciprofloxacin. He had some improvement and underwent left ankle fusion in 1995. He did well in 2003 when an area on his lateral ankle began to drain intermittently. He was being followed at a [facility] in [place], [place]. He has taken antibiotics intermittently with slight benefit. At the end of June he developed sudden onset of cellulitis in his left lower leg. He has had chronic lymphedema since his initial surgeries but never had cellulitis  before. He was seen at a local urgent care center and started on doxycycline around June 23. He did not notice much improvement so he went to the emergency department on July 3. He was started on oral clindamycin and begin to notice some improvement. He stayed on clindamycin until July 12. After the cellulitis resolved he had drainage from the lateral wound but also developed a wound on the medial ankle. He was seen by Dr. Doran Durand who admitted him for I and D and hardware removal yesterday. The medial ankle wound did track down to bone which was curetted and sent for culture. Operative Gram stain did not reveal any organisms and cultures were negative. He was discharged home on IV vancomycin and has now completed 28 days. He's had no problems tolerating his PICC or vancomycin. He recently noticed slight opening of the distal incision on his medial left ankle and has had some yellow-green drainage.    Review of Systems: Pertinent items are noted in HPI.  Past Medical History  Diagnosis Date  . Hypertension   . Cancer     crown of scalp, melenoma    History  Substance Use Topics  . Smoking status: Never Smoker   . Smokeless tobacco: Never Used  . Alcohol Use: No    No family history on file.  Allergies  Allergen Reactions  . Penicillins Other (See Comments)    Childhood reaction      Objective: Temp: 98.3 F (36.8 C) (08/26 1108) Temp src: Oral (08/26 1108) BP: 182/117 mmHg (08/26 1108) Pulse Rate: 139 (08/26 1108)  General: he is obese. He is in good spirits Skin: right arm PICC site is normal Left leg: He has chronic venous stasis dermatitis and edema. The incisions on his lateral ankle with good. 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 I will continue vancomycin at least 2 more weeks  then consider conversion to oral therapy.  Plan: 1. Continue IV vancomycin for now 2. Check sedimentation rate and C-reactive protein 3. Followup on September 8   Michel Bickers, [place] for [facility] [phone] pager   [phone] cell 01/26/2014, 11:35 AM

## 2014-02-10 ENCOUNTER — Encounter: Payer: Self-pay | Admitting: Internal Medicine

## 2014-02-10 ENCOUNTER — Ambulatory Visit (INDEPENDENT_AMBULATORY_CARE_PROVIDER_SITE_OTHER): Payer: 59 | Admitting: Internal Medicine

## 2014-02-10 VITALS — BP 146/80 | HR 92 | Temp 98.4°F | Wt 282.0 lb

## 2014-02-10 DIAGNOSIS — Z5189 Encounter for other specified aftercare: Secondary | ICD-10-CM

## 2014-02-10 DIAGNOSIS — T847XXA Infection and inflammatory reaction due to other internal orthopedic prosthetic devices, implants and grafts, initial encounter: Secondary | ICD-10-CM

## 2014-02-10 DIAGNOSIS — T847XXD Infection and inflammatory reaction due to other internal orthopedic prosthetic devices, implants and grafts, subsequent encounter: Secondary | ICD-10-CM

## 2014-02-10 MED ORDER — CIPROFLOXACIN HCL 750 MG PO TABS: 750.0000 mg | ORAL_TABLET | Freq: Two times a day (BID) | ORAL | Status: DC

## 2014-02-10 NOTE — Progress Notes (Signed)
Patient ID: [name], male   DOB: 24-Jul-1960, 53 y.o.   MRN: [id number]         [facility] for Infectious Disease  Patient Active Problem List   Diagnosis Date Noted  . History of cellulitis 12/31/2013  . Lymphedema of left leg 12/31/2013  . HTN (hypertension) 12/31/2013  . Neuropathy 12/31/2013  . Hyperglycemia 12/31/2013  . Wound infection complicating hardware 42/35/3614    Patient's Medications  New Prescriptions   CIPROFLOXACIN (CIPRO) 750 MG TABLET    Take 1 tablet (750 mg total) by mouth 2 (two) times daily.  Previous Medications   AMLODIPINE (NORVASC) 5 MG TABLET    Take 5 mg by mouth daily.   GABAPENTIN (NEURONTIN) 100 MG CAPSULE    Take 100 mg by mouth 4 (four) times daily.   HYDROCHLOROTHIAZIDE (HYDRODIURIL) 25 MG TABLET    Take 25 mg by mouth daily.   LISINOPRIL (PRINIVIL,ZESTRIL) 5 MG TABLET    Take 5 mg by mouth daily.   MELOXICAM (MOBIC) 7.5 MG TABLET    Take 7.5 mg by mouth 2 (two) times daily.   TRAMADOL (ULTRAM) 50 MG TABLET    Take 50 mg by mouth every 6 (six) hours as needed (for pain).    VANCOMYCIN 1,500 MG IN SODIUM CHLORIDE 0.9 % 500 ML    Inject 1,500 mg into the vein every 12 (twelve) hours.  Modified Medications   No medications on file  Discontinued Medications   No medications on file    Subjective: [name] is in for his routine visit. He is now completed 6 weeks of IV vancomycin for his smoldering left ankle infection. He has not had any problems tolerating his vancomycin but his level II days ago was elevated at 23.6. His creatinine remains normal at 1.02. Yesterday his vancomycin dose was held. He is able to infuse vancomycin through his PICC but his home nurse has been unable to draw blood for the past 5 days. In order to declot the PICC he was told he would have to go to the emergency department.  He has not noted any improvement in his swelling or pain in his left ankle and he thinks that the small pinhole sinus tract has actually gotten  slightly larger. He is also noted a slight increase in drainage from the sinus tract recently. He has not had any fever, chills or sweats. Review of Systems: Pertinent items are noted in HPI.  Past Medical History  Diagnosis Date  . Hypertension   . Cancer     crown of scalp, melenoma    History  Substance Use Topics  . Smoking status: Never Smoker   . Smokeless tobacco: Never Used  . Alcohol Use: No    No family history on file.  Allergies  Allergen Reactions  . Penicillins Other (See Comments)    Childhood reaction      Objective: Temp: 98.4 F (36.9 C) (09/10 1009) Temp src: Oral (09/10 1009) BP: 146/80 mmHg (09/10 1009) Pulse Rate: 92 (09/10 1009)  General: He is in good spirits Skin: Right arm PICC site appears normal Left ankle: He has mild, chronic swelling. There is a pinhole sinus tract over his medial malleolus with a small amount of bloody drainage on his sock. The area is tender to palpation.    Assessment: He has a history of MRSA infection over 20 years ago but he is not responding to presumed MRSA osteomyelitis. I will continue vancomycin but add ciprofloxacin for now. He  is in agreement with that plan.  Plan: 1. Continue vancomycin 2. Start ciprofloxacin 750 mg by mouth twice daily 3. Check sedimentation rate and C-reactive protein 4. Attempt to arrange declotting of his PICC in short stay unit in the hospital 5. Followup on September 30   Michel Bickers, [place] for [place] [phone] pager   [phone] cell 02/10/2014, 10:30 AM

## 2014-02-11 ENCOUNTER — Telehealth: Payer: Self-pay | Admitting: *Deleted

## 2014-02-11 ENCOUNTER — Ambulatory Visit (HOSPITAL_COMMUNITY)
Admission: RE | Admit: 2014-02-11 | Discharge: 2014-02-11 | Disposition: A | Discharge status: Home / Self Care | Payer: 59 | Source: Ambulatory Visit | Attending: Internal Medicine | Admitting: Internal Medicine

## 2014-02-11 ENCOUNTER — Encounter: Payer: Self-pay | Admitting: Internal Medicine

## 2014-02-11 DIAGNOSIS — T847XXA Infection and inflammatory reaction due to other internal orthopedic prosthetic devices, implants and grafts, initial encounter: Secondary | ICD-10-CM | POA: Insufficient documentation

## 2014-02-11 MED ORDER — HEPARIN SOD (PORK) LOCK FLUSH 100 UNIT/ML IV SOLN
250.0000 [IU] | INTRAVENOUS | Status: AC | PRN
  Administered 2014-02-11: 250 [IU]

## 2014-02-11 MED ORDER — ALTEPLASE 100 MG IV SOLR
2.0000 mg | Freq: Once | INTRAVENOUS | Status: AC
  Administered 2014-02-11: 2 mg
  Filled 2014-02-11: qty 2

## 2014-02-11 NOTE — Telephone Encounter (Signed)
Patient called regarding a note to be out of work until his next office visit. Dr. Megan Salon provided a letter and placed in folder up front for patient pick up. Myrtis Hopping

## 2014-02-11 NOTE — Telephone Encounter (Signed)
Order given to [place] to Add sed rate and CRP to lab work per Dr. Hale Bogus office note 02/10/14.  Verbalized back this order.  Noted that the pt's next office visit will be 03/02/14.  Vancomycin will continue per the office note.

## 2014-02-16 ENCOUNTER — Encounter: Payer: Self-pay | Admitting: Internal Medicine

## 2014-02-16 ENCOUNTER — Telehealth: Payer: Self-pay | Admitting: *Deleted

## 2014-02-16 NOTE — Telephone Encounter (Signed)
Faxed last office note to Dr Selmer [place] for his appointment 9/21 as requested by the patient. Faxed to [phone] (number provided by patient). Landis Gandy, RN

## 2014-02-21 ENCOUNTER — Telehealth: Payer: Self-pay | Admitting: *Deleted

## 2014-02-21 NOTE — Telephone Encounter (Signed)
Patient unavailable today for labs (ESR, CRP, CBC, Vanc [place], BUN, Creatinine) - he is scheduled at a doctor's visit.  HHRN Delena Serve would like to move these labs to tomorrow (9/22) so that the patient does not have to disrupt his vancomycin administration for labs this afternoon. Please advise if labs can be delayed 1 day. Landis Gandy, RN

## 2014-02-21 NOTE — Telephone Encounter (Signed)
Yes

## 2014-02-21 NOTE — Telephone Encounter (Signed)
RN notified

## 2014-02-22 ENCOUNTER — Encounter: Payer: Self-pay | Admitting: Internal Medicine

## 2014-02-23 ENCOUNTER — Telehealth: Payer: Self-pay | Admitting: Licensed Clinical Social Worker

## 2014-02-23 NOTE — Telephone Encounter (Signed)
Heather, RN with [place] called to report a new open area on the left outer ankle, with swelling. Denies draining. Patients pain is unchanged, but has not increased. Patient has an appointment with Dr. Megan Salon on 9/30. Per RN she states that she advised the patient to call if symptoms worsen or he has increased drainage.

## 2014-02-25 ENCOUNTER — Other Ambulatory Visit: Payer: Self-pay | Admitting: Family Medicine

## 2014-02-28 ENCOUNTER — Telehealth: Payer: Self-pay | Admitting: *Deleted

## 2014-02-28 NOTE — Telephone Encounter (Signed)
Please advise a new end date for orders, as the patient's insurance was originally authorized through 10/2. Additionally, patient is only written out of work through 10/1.  Please advise a new return to work date.  Pt is scheduled 10/5 with Dr. Johnnye Sima, 10/27 with Dr. Megan Salon. Landis Gandy, RN

## 2014-02-28 NOTE — Telephone Encounter (Signed)
Appt Conflict on 1/88/67 w/ Dr. Doran Durand.  Appts at the same time.  Next appt w/ Dr. Megan Salon available 03/29/14 PM.  Last appt with Dr. Doran Durand, 03/06/14.  Pt reports dime size serosanguinous drainage with BID dressing change.  Some erythema and some increased heat around the wound.  Taking cipro and IV vancomycin.  MD please advise.

## 2014-02-28 NOTE — Telephone Encounter (Signed)
Please reschedule him with one of my partners shortly after his scheduled visit with Dr. Doran Durand on October 4. Have him keep his scheduled appointment with me on October 27.

## 2014-03-02 ENCOUNTER — Ambulatory Visit: Payer: 59 | Admitting: Internal Medicine

## 2014-03-03 ENCOUNTER — Telehealth: Payer: Self-pay | Admitting: *Deleted

## 2014-03-03 NOTE — Telephone Encounter (Signed)
Patient's end date for IV antibiotic is tomorrow 03/04/14 and follow up in clinic is 03/07/14. Should antibiotic be extended until follow up. Please advise Myrtis Hopping

## 2014-03-04 NOTE — Telephone Encounter (Signed)
Patient called back today and I advised him we have not gotten word back from Dr Megan Salon yet but that he should flush his line daily and if we here from him today we will call him. Also advised him to keep his appt Monday 03/07/14 and if he wants to extend the medication we will do so then or if he wants to D/C we can do that as well on Monday at his visit. The patient was fine with this information and advised he will be here.

## 2014-03-04 NOTE — Telephone Encounter (Signed)
Please give an order to extend his antibiotic therapy at least until his followup visit.

## 2014-03-07 ENCOUNTER — Ambulatory Visit (INDEPENDENT_AMBULATORY_CARE_PROVIDER_SITE_OTHER): Payer: 59 | Admitting: Infectious Diseases

## 2014-03-07 ENCOUNTER — Encounter: Payer: Self-pay | Admitting: Infectious Diseases

## 2014-03-07 VITALS — BP 132/94 | HR 79 | Temp 98.0°F | Ht 67.5 in | Wt 287.0 lb

## 2014-03-07 DIAGNOSIS — T847XXS Infection and inflammatory reaction due to other internal orthopedic prosthetic devices, implants and grafts, sequela: Secondary | ICD-10-CM

## 2014-03-07 MED ORDER — SULFAMETHOXAZOLE-TMP DS 800-160 MG PO TABS: 1.0000 | ORAL_TABLET | Freq: Two times a day (BID) | ORAL | Status: DC

## 2014-03-07 NOTE — Progress Notes (Signed)
RN received verbal order to discontinue the patient's PICC line.  Patient identified with name and date of birth. PICC dressing removed, site unremarkable.    PICC line removed using sterile procedure @ 1645. PICC length equal to that noted in patient's hospital chart of 43 cm. Sterile petroleum gauze + sterile 4X4 applied to PICC site, pressure applied for 10 minutes and covered with Medipore tape as a pressure dressing. Patient tolerated procedure without complaints.  Patient instructed to limit use of arm for 1 hour. Patient instructed that the pressure dressing should remain in place for 24 hours. Patient verbalized understanding of these instructions.

## 2014-03-07 NOTE — Progress Notes (Signed)
   Subjective:    Patient ID: [name], male    DOB: [birth year], 53 y.o.   MRN: [id number]  HPI 53 y.o. male who suffered an injury to his left ankle in 1980 resulting in a foreign body he was unaware of. He developed infection he recalls being MRSA. He underwent multiple debridements and 1992 and 1993. He recalls being on long courses of IV vancomycin as well as oral cephalexin and ciprofloxacin. He had some improvement and underwent left ankle fusion in 1995. He did well in 2003 when an area on his lateral ankle began to drain intermittently. He was being followed at a [facility] in [place], [place]. He has taken antibiotics intermittently with slight benefit. At the end of June 2015, he developed sudden onset of cellulitis in his left lower leg. He has had chronic lymphedema since his initial surgeries but never had cellulitis before. He was seen at a local urgent care center and started on doxycycline around June 23. He did not notice much improvement so he went to the emergency department on July 3. He was started on oral clindamycin and begin to notice some improvement. He stayed on clindamycin until July 12. After the cellulitis resolved he had drainage from the lateral wound but also developed a wound on the medial ankle. He was seen by Dr. Doran Durand who admitted him for I and D and hardware removal 12-30-13. The medial ankle wound did track down to bone which was curetted and sent for culture. His Cx were negative. He was discharged home on IV vancomycin. He was seen in f/u 02-10-14 and had cipro added.  He still has some wound drainage. The fluid is clear-red. No green. Does not smell. He completed his IV vanco on 03-03-14.No f/c. No problems with his PIC.    Review of Systems     Objective:   Physical Exam  Constitutional: He appears well-developed and well-nourished.  Musculoskeletal:       Arms:      Feet:          Assessment & Plan:

## 2014-03-07 NOTE — Telephone Encounter (Signed)
Just saw this this morning; as Friday was my 1/2 day. Dr. Johnnye Sima to address at today's appointment.

## 2014-03-07 NOTE — Assessment & Plan Note (Addendum)
Will pull his PIC today and start him on bactrim. Will have him back to see Dr Megan Salon on the 27th. He has f/u with Dr Doran Durand in the intervening period. He will have MRI to f/u on 03-11-14.  Has been out on short term disability- Still has pain with walking. Would like to see the wound on his ankle close before going back to work.   Labs- 02-27-14 ESR 23, CRP 1.9 WBC 6.3 Cr 0.83 vanco tr 17.4

## 2014-03-09 ENCOUNTER — Telehealth: Payer: Self-pay | Admitting: *Deleted

## 2014-03-09 NOTE — Telephone Encounter (Signed)
[name] at [name] notified that patient's picc line was pulled here in the office on 03/07/14. [name]

## 2014-03-16 ENCOUNTER — Encounter: Payer: Self-pay | Admitting: Internal Medicine

## 2014-03-16 ENCOUNTER — Telehealth: Payer: Self-pay | Admitting: *Deleted

## 2014-03-16 NOTE — Telephone Encounter (Signed)
Patient called requesting office notes verifying restrictions and next office visit be faxed to [facility] for his short term disability. Dr. Megan Salon wrote him out of work from 02/11/14 through 03/02/14. Claim # [id number] phone [phone]. Spoke with [employer] and they will fax over a form with requested information. Patient is asking to be written out of work until his follow up on 03/29/14. He saw Dr. Doran Durand today and was told he may have to have another surgery. Dr. Doran Durand has filled out FMLA in the past.

## 2014-03-17 ENCOUNTER — Encounter (HOSPITAL_COMMUNITY): Payer: Self-pay | Admitting: Pharmacy Technician

## 2014-03-23 ENCOUNTER — Encounter (HOSPITAL_COMMUNITY): Payer: Self-pay

## 2014-03-23 ENCOUNTER — Other Ambulatory Visit: Payer: Self-pay | Admitting: Physician Assistant

## 2014-03-23 ENCOUNTER — Encounter (HOSPITAL_COMMUNITY)
Admission: RE | Admit: 2014-03-23 | Discharge: 2014-03-23 | Disposition: A | Discharge status: Home / Self Care | Payer: 59 | Source: Ambulatory Visit | Attending: Orthopedic Surgery | Admitting: Orthopedic Surgery

## 2014-03-23 HISTORY — DX: Unspecified osteoarthritis, unspecified site: M19.90

## 2014-03-23 LAB — CBC
HEMATOCRIT: 38 % — AB (ref 39.0–52.0)
Hemoglobin: 12.8 g/dL — ABNORMAL LOW (ref 13.0–17.0)
MCH: 29.8 pg (ref 26.0–34.0)
MCHC: 33.7 g/dL (ref 30.0–36.0)
MCV: 88.4 fL (ref 78.0–100.0)
Platelets: 183 10*3/uL (ref 150–400)
RBC: 4.3 MIL/uL (ref 4.22–5.81)
RDW: 13.6 % (ref 11.5–15.5)
WBC: 7.8 10*3/uL (ref 4.0–10.5)

## 2014-03-23 LAB — BASIC METABOLIC PANEL
Anion gap: 13 (ref 5–15)
BUN: 18 mg/dL (ref 6–23)
CHLORIDE: 103 meq/L (ref 96–112)
CO2: 27 mEq/L (ref 19–32)
CREATININE: 0.81 mg/dL (ref 0.50–1.35)
Calcium: 9.1 mg/dL (ref 8.4–10.5)
GFR calc Af Amer: >90 mL/min (ref >90)
GFR calc non Af Amer: >90 mL/min (ref >90)
GLUCOSE: 116 mg/dL — AB (ref 70–99)
POTASSIUM: 4.1 meq/L (ref 3.7–5.3)
Sodium: 143 mEq/L (ref 137–147)

## 2014-03-23 NOTE — Pre-Procedure Instructions (Signed)
Hawken Hannula  03/23/2014   Your procedure is scheduled on:   Thursday  03/24/14  Report to [facility] Admitting at 1:15 PM.  Call this number if you have problems the morning of surgery: [phone]   Remember:   Do not eat food or drink liquids after midnight.   Take these medicines the morning of surgery with A SIP OF WATER:  AMLODIPINE (NORVASC), GABAPENTIN (NEURONTIN)  (STOP IBUPROFEN/ ADVIL/ MOTRIN/ MOBIC)   Do not wear jewelry, make-up or nail polish.  Do not wear lotions, powders, or perfumes. You may wear deodorant.  Do not shave 48 hours prior to surgery. Men may shave face and neck.  Do not bring valuables to the hospital.  [facility] is not responsible                  for any belongings or valuables.               Contacts, dentures or bridgework may not be worn into surgery.  Leave suitcase in the car. After surgery it may be brought to your room.  For patients admitted to the hospital, discharge time is determined by your                treatment team.               Patients discharged the day of surgery will not be allowed to drive  home.  Name and phone number of your driver:   Special Instructions:  Special Instructions: [place] - Preparing for Surgery  Before surgery, you can play an important role.  Because skin is not sterile, your skin needs to be as free of germs as possible.  You can reduce the number of germs on you skin by washing with CHG (chlorahexidine gluconate) soap before surgery.  CHG is an antiseptic cleaner which kills germs and bonds with the skin to continue killing germs even after washing.  Please DO NOT use if you have an allergy to CHG or antibacterial soaps.  If your skin becomes reddened/irritated stop using the CHG and inform your nurse when you arrive at Short Stay.  Do not shave (including legs and underarms) for at least 48 hours prior to the first CHG shower.  You may shave your face.  Please follow these instructions  carefully:   1.  Shower with CHG Soap the night before surgery and the morning of Surgery.  2.  If you choose to wash your hair, wash your hair first as usual with your normal shampoo.  3.  After you shampoo, rinse your hair and body thoroughly to remove the Shampoo.  4.  Use CHG as you would any other liquid soap. You can apply chg directly to the skin and wash gently with scrungie or a clean washcloth.  5.  Apply the CHG Soap to your body ONLY FROM THE NECK DOWN.  Do not use on open wounds or open sores.  Avoid contact with your eyes, ears, mouth and genitals (private parts).  Wash genitals (private parts with your normal soap.  6.  Wash thoroughly, paying special attention to the area where your surgery will be performed.  7.  Thoroughly rinse your body with warm water from the neck down.  8.  DO NOT shower/wash with your normal soap after using and rinsing off the CHG Soap.  9.  Pat yourself dry with a clean towel.  10.  Wear clean pajamas.            11.  Place clean sheets on your bed the night of your first shower and do not sleep with pets.  Day of Surgery  Do not apply any lotions/deodorants the morning of surgery.  Please wear clean clothes to the hospital/surgery center.   Please read over the following fact sheets that you were given: Pain Booklet, Coughing and Deep Breathing and Surgical Site Infection Prevention

## 2014-03-23 NOTE — Progress Notes (Signed)
03/23/14 1302  OBSTRUCTIVE SLEEP APNEA  Have you ever been diagnosed with sleep apnea through a sleep study? No  Do you snore loudly (loud enough to be heard through closed doors)?  0  Do you often feel tired, fatigued, or sleepy during the daytime? 0  Has anyone observed you stop breathing during your sleep? 0  Do you have, or are you being treated for high blood pressure? 1  BMI more than 35 kg/m2? 1  Age over 54 years old? 1  Neck circumference greater than 40 cm/16 inches? 1  Gender: 1  Obstructive Sleep Apnea Score 5  Score 4 or greater  Results sent to PCP

## 2014-03-23 NOTE — Progress Notes (Signed)
PCP is Dr. Doreene Nest Denies seeing a cardiologist. EKG and CXR noted in epic from 12-28-13. Denies having a stress test, echo, or card cath. Had Surgery in July (see note from [facility]).

## 2014-03-23 NOTE — Progress Notes (Signed)
LEFT MESSAGE FOR [facility] AT DR. Nona Dell OFFICE FOR ORDERS.

## 2014-03-24 ENCOUNTER — Inpatient Hospital Stay (HOSPITAL_COMMUNITY)
Admission: RE | Admit: 2014-03-24 | Discharge: 2014-03-29 | DRG: 493 | Disposition: A | Discharge status: Home / Self Care | Payer: 59 | Source: Ambulatory Visit | Attending: Orthopedic Surgery | Admitting: Orthopedic Surgery

## 2014-03-24 ENCOUNTER — Ambulatory Visit (HOSPITAL_COMMUNITY): Payer: 59 | Admitting: Certified Registered Nurse Anesthetist

## 2014-03-24 ENCOUNTER — Encounter (HOSPITAL_COMMUNITY): Payer: 59 | Admitting: Certified Registered Nurse Anesthetist

## 2014-03-24 ENCOUNTER — Encounter (HOSPITAL_COMMUNITY)
Admission: RE | Disposition: A | Discharge status: Home / Self Care | Payer: Self-pay | Source: Ambulatory Visit | Attending: Orthopedic Surgery

## 2014-03-24 ENCOUNTER — Encounter (HOSPITAL_COMMUNITY): Payer: Self-pay | Admitting: *Deleted

## 2014-03-24 DIAGNOSIS — Z880 Allergy status to penicillin: Secondary | ICD-10-CM

## 2014-03-24 DIAGNOSIS — Z8614 Personal history of Methicillin resistant Staphylococcus aureus infection: Secondary | ICD-10-CM | POA: Diagnosis not present

## 2014-03-24 DIAGNOSIS — M86472 Chronic osteomyelitis with draining sinus, left ankle and foot: Principal | ICD-10-CM | POA: Diagnosis present

## 2014-03-24 DIAGNOSIS — M1990 Unspecified osteoarthritis, unspecified site: Secondary | ICD-10-CM | POA: Diagnosis present

## 2014-03-24 DIAGNOSIS — Z872 Personal history of diseases of the skin and subcutaneous tissue: Secondary | ICD-10-CM

## 2014-03-24 DIAGNOSIS — I10 Essential (primary) hypertension: Secondary | ICD-10-CM | POA: Diagnosis present

## 2014-03-24 DIAGNOSIS — T847XXS Infection and inflammatory reaction due to other internal orthopedic prosthetic devices, implants and grafts, sequela: Secondary | ICD-10-CM

## 2014-03-24 DIAGNOSIS — E669 Obesity, unspecified: Secondary | ICD-10-CM | POA: Diagnosis present

## 2014-03-24 DIAGNOSIS — I890 Lymphedema, not elsewhere classified: Secondary | ICD-10-CM

## 2014-03-24 DIAGNOSIS — M25561 Pain in right knee: Secondary | ICD-10-CM

## 2014-03-24 DIAGNOSIS — Z6841 Body Mass Index (BMI) 40.0 and over, adult: Secondary | ICD-10-CM | POA: Diagnosis not present

## 2014-03-24 DIAGNOSIS — M109 Gout, unspecified: Secondary | ICD-10-CM | POA: Diagnosis present

## 2014-03-24 DIAGNOSIS — M869 Osteomyelitis, unspecified: Secondary | ICD-10-CM | POA: Diagnosis present

## 2014-03-24 DIAGNOSIS — M868X7 Other osteomyelitis, ankle and foot: Secondary | ICD-10-CM | POA: Diagnosis present

## 2014-03-24 DIAGNOSIS — Z85820 Personal history of malignant melanoma of skin: Secondary | ICD-10-CM | POA: Diagnosis not present

## 2014-03-24 HISTORY — PX: ANKLE ARTHROSCOPY: SHX545

## 2014-03-24 SURGERY — ARTHROSCOPY, ANKLE
Anesthesia: General | Site: Ankle | Laterality: Left

## 2014-03-24 MED ORDER — PHENYLEPHRINE HCL 10 MG/ML IJ SOLN
INTRAMUSCULAR | Status: DC | PRN
  Administered 2014-03-24: 40 ug via INTRAVENOUS

## 2014-03-24 MED ORDER — ONDANSETRON HCL 4 MG PO TABS: 4.0000 mg | ORAL_TABLET | Freq: Four times a day (QID) | ORAL | Status: DC | PRN

## 2014-03-24 MED ORDER — BACITRACIN ZINC 500 UNIT/GM EX OINT
TOPICAL_OINTMENT | CUTANEOUS | Status: AC
  Filled 2014-03-24: qty 15

## 2014-03-24 MED ORDER — DOCUSATE SODIUM 100 MG PO CAPS
100.0000 mg | ORAL_CAPSULE | Freq: Two times a day (BID) | ORAL | Status: DC
  Administered 2014-03-24 – 2014-03-29 (×10): 100 mg via ORAL
  Filled 2014-03-24 (×10): qty 1

## 2014-03-24 MED ORDER — LACTATED RINGERS IV SOLN
INTRAVENOUS | Status: DC
  Administered 2014-03-24: 14:00:00 via INTRAVENOUS

## 2014-03-24 MED ORDER — SODIUM CHLORIDE 0.9 % IR SOLN
Status: DC | PRN
  Administered 2014-03-24: 3000 mL

## 2014-03-24 MED ORDER — VANCOMYCIN HCL 500 MG IV SOLR
INTRAVENOUS | Status: AC
  Filled 2014-03-24: qty 1000

## 2014-03-24 MED ORDER — HYDROMORPHONE HCL 1 MG/ML IJ SOLN
INTRAMUSCULAR | Status: AC
  Administered 2014-03-24: 0.5 mg via INTRAVENOUS
  Filled 2014-03-24: qty 1

## 2014-03-24 MED ORDER — VANCOMYCIN HCL IN DEXTROSE 1-5 GM/200ML-% IV SOLN
INTRAVENOUS | Status: AC
  Filled 2014-03-24: qty 200

## 2014-03-24 MED ORDER — HYDROMORPHONE HCL 1 MG/ML IJ SOLN
0.5000 mg | INTRAMUSCULAR | Status: DC | PRN
  Administered 2014-03-24 (×3): 0.5 mg via INTRAVENOUS

## 2014-03-24 MED ORDER — OXYCODONE HCL 5 MG PO TABS
ORAL_TABLET | ORAL | Status: AC
  Administered 2014-03-24: 10 mg via ORAL
  Filled 2014-03-24: qty 2

## 2014-03-24 MED ORDER — HYDROCHLOROTHIAZIDE 25 MG PO TABS
25.0000 mg | ORAL_TABLET | Freq: Every day | ORAL | Status: DC
  Administered 2014-03-25 – 2014-03-29 (×5): 25 mg via ORAL
  Filled 2014-03-24 (×5): qty 1

## 2014-03-24 MED ORDER — VANCOMYCIN HCL 1000 MG IV SOLR
1000.0000 mg | INTRAVENOUS | Status: DC | PRN
  Administered 2014-03-24: 1000 mg via INTRAVENOUS

## 2014-03-24 MED ORDER — METOCLOPRAMIDE HCL 10 MG PO TABS: 5.0000 mg | ORAL_TABLET | Freq: Three times a day (TID) | ORAL | Status: DC | PRN

## 2014-03-24 MED ORDER — MIDAZOLAM HCL 2 MG/2ML IJ SOLN
INTRAMUSCULAR | Status: AC
  Filled 2014-03-24: qty 2

## 2014-03-24 MED ORDER — FENTANYL CITRATE 0.05 MG/ML IJ SOLN
INTRAMUSCULAR | Status: AC
  Filled 2014-03-24: qty 5

## 2014-03-24 MED ORDER — LACTATED RINGERS IV SOLN
INTRAVENOUS | Status: DC | PRN
Start: 2014-03-24 — End: 2014-03-24
  Administered 2014-03-24: 17:00:00 via INTRAVENOUS

## 2014-03-24 MED ORDER — LIDOCAINE HCL (CARDIAC) 20 MG/ML IV SOLN
INTRAVENOUS | Status: DC | PRN
  Administered 2014-03-24: 50 mg via INTRAVENOUS

## 2014-03-24 MED ORDER — PROPOFOL 10 MG/ML IV BOLUS
INTRAVENOUS | Status: AC
  Filled 2014-03-24: qty 20

## 2014-03-24 MED ORDER — HYDROMORPHONE HCL 1 MG/ML IJ SOLN
0.5000 mg | INTRAMUSCULAR | Status: DC | PRN
  Administered 2014-03-25 – 2014-03-27 (×10): 1 mg via INTRAVENOUS
  Administered 2014-03-28: 0.5 mg via INTRAVENOUS
  Administered 2014-03-28 – 2014-03-29 (×4): 1 mg via INTRAVENOUS
  Filled 2014-03-24 (×15): qty 1

## 2014-03-24 MED ORDER — GLYCOPYRROLATE 0.2 MG/ML IJ SOLN
INTRAMUSCULAR | Status: DC | PRN
  Administered 2014-03-24: 0.1 mg via INTRAVENOUS

## 2014-03-24 MED ORDER — VANCOMYCIN HCL 500 MG IV SOLR
INTRAVENOUS | Status: DC | PRN
  Administered 2014-03-24: 1000 mg

## 2014-03-24 MED ORDER — GABAPENTIN 100 MG PO CAPS
100.0000 mg | ORAL_CAPSULE | Freq: Four times a day (QID) | ORAL | Status: DC
  Administered 2014-03-24 – 2014-03-29 (×19): 100 mg via ORAL
  Filled 2014-03-24 (×21): qty 1

## 2014-03-24 MED ORDER — ONDANSETRON HCL 4 MG/2ML IJ SOLN
INTRAMUSCULAR | Status: AC
  Filled 2014-03-24: qty 2

## 2014-03-24 MED ORDER — PROPOFOL 10 MG/ML IV BOLUS
INTRAVENOUS | Status: DC | PRN
  Administered 2014-03-24: 200 mg via INTRAVENOUS

## 2014-03-24 MED ORDER — METOCLOPRAMIDE HCL 5 MG/ML IJ SOLN: 5.0000 mg | Freq: Three times a day (TID) | INTRAMUSCULAR | Status: DC | PRN

## 2014-03-24 MED ORDER — VANCOMYCIN HCL 10 G IV SOLR
1250.0000 mg | Freq: Two times a day (BID) | INTRAVENOUS | Status: DC
  Administered 2014-03-25: 1250 mg via INTRAVENOUS
  Filled 2014-03-24 (×2): qty 1250

## 2014-03-24 MED ORDER — PIPERACILLIN-TAZOBACTAM 3.375 G IVPB
3.3750 g | Freq: Three times a day (TID) | INTRAVENOUS | Status: DC
  Administered 2014-03-24 – 2014-03-25 (×2): 3.375 g via INTRAVENOUS
  Filled 2014-03-24 (×4): qty 50

## 2014-03-24 MED ORDER — HYDROMORPHONE HCL 1 MG/ML IJ SOLN
0.2500 mg | INTRAMUSCULAR | Status: DC | PRN
  Administered 2014-03-24 (×4): 0.5 mg via INTRAVENOUS

## 2014-03-24 MED ORDER — ONDANSETRON HCL 4 MG/2ML IJ SOLN
INTRAMUSCULAR | Status: DC | PRN
  Administered 2014-03-24: 4 mg via INTRAVENOUS

## 2014-03-24 MED ORDER — FENTANYL CITRATE 0.05 MG/ML IJ SOLN
INTRAMUSCULAR | Status: DC | PRN
  Administered 2014-03-24 (×5): 50 ug via INTRAVENOUS

## 2014-03-24 MED ORDER — ONDANSETRON HCL 4 MG/2ML IJ SOLN
4.0000 mg | Freq: Four times a day (QID) | INTRAMUSCULAR | Status: DC | PRN
Start: 2014-03-24 — End: 2014-03-29

## 2014-03-24 MED ORDER — LIDOCAINE HCL (CARDIAC) 20 MG/ML IV SOLN
INTRAVENOUS | Status: AC
  Filled 2014-03-24: qty 5

## 2014-03-24 MED ORDER — CHLORHEXIDINE GLUCONATE 4 % EX LIQD
60.0000 mL | Freq: Once | CUTANEOUS | Status: DC
  Filled 2014-03-24: qty 60

## 2014-03-24 MED ORDER — OXYCODONE HCL 5 MG/5ML PO SOLN: 5.0000 mg | Freq: Once | ORAL | Status: DC | PRN

## 2014-03-24 MED ORDER — ACETAMINOPHEN 500 MG PO TABS
1000.0000 mg | ORAL_TABLET | Freq: Once | ORAL | Status: AC
  Administered 2014-03-24: 1000 mg via ORAL
  Filled 2014-03-24: qty 2

## 2014-03-24 MED ORDER — SENNA 8.6 MG PO TABS
1.0000 | ORAL_TABLET | Freq: Two times a day (BID) | ORAL | Status: DC
  Administered 2014-03-24 – 2014-03-29 (×10): 8.6 mg via ORAL
  Filled 2014-03-24 (×11): qty 1

## 2014-03-24 MED ORDER — AMLODIPINE BESYLATE 5 MG PO TABS
5.0000 mg | ORAL_TABLET | Freq: Every day | ORAL | Status: DC
  Administered 2014-03-25 – 2014-03-29 (×5): 5 mg via ORAL
  Filled 2014-03-24 (×5): qty 1

## 2014-03-24 MED ORDER — LISINOPRIL 5 MG PO TABS
5.0000 mg | ORAL_TABLET | Freq: Every day | ORAL | Status: DC
  Administered 2014-03-25 – 2014-03-29 (×5): 5 mg via ORAL
  Filled 2014-03-24 (×5): qty 1

## 2014-03-24 MED ORDER — OXYCODONE HCL 5 MG PO TABS
5.0000 mg | ORAL_TABLET | ORAL | Status: DC | PRN
  Administered 2014-03-24 – 2014-03-29 (×23): 10 mg via ORAL
  Filled 2014-03-24 (×9): qty 2
  Filled 2014-03-24: qty 1
  Filled 2014-03-24 (×13): qty 2

## 2014-03-24 MED ORDER — SODIUM CHLORIDE 0.9 % IV SOLN
INTRAVENOUS | Status: DC
  Administered 2014-03-25 – 2014-03-26 (×2): via INTRAVENOUS

## 2014-03-24 MED ORDER — SODIUM CHLORIDE 0.9 % IV SOLN
INTRAVENOUS | Status: DC
Start: 2014-03-24 — End: 2014-03-24

## 2014-03-24 MED ORDER — OXYCODONE HCL 5 MG PO TABS: 5.0000 mg | ORAL_TABLET | Freq: Once | ORAL | Status: DC | PRN

## 2014-03-24 MED ORDER — ONDANSETRON HCL 4 MG/2ML IJ SOLN: 4.0000 mg | Freq: Four times a day (QID) | INTRAMUSCULAR | Status: DC | PRN

## 2014-03-24 MED ORDER — INSULIN ASPART 100 UNIT/ML ~~LOC~~ SOLN
0.0000 [IU] | Freq: Three times a day (TID) | SUBCUTANEOUS | Status: DC
  Administered 2014-03-25 – 2014-03-27 (×4): 2 [IU] via SUBCUTANEOUS
  Administered 2014-03-28: 3 [IU] via SUBCUTANEOUS
  Administered 2014-03-29: 2 [IU] via SUBCUTANEOUS

## 2014-03-24 MED ORDER — MIDAZOLAM HCL 5 MG/5ML IJ SOLN
INTRAMUSCULAR | Status: DC | PRN
  Administered 2014-03-24: 2 mg via INTRAVENOUS

## 2014-03-24 SURGICAL SUPPLY — 70 items
BANDAGE ELASTIC 4 VELCRO ST LF (GAUZE/BANDAGES/DRESSINGS) ×2 IMPLANT
BLADE GREAT WHITE 4.2 (BLADE) IMPLANT
BLADE SURG 15 STRL LF DISP TIS (BLADE) IMPLANT
BLADE SURG 15 STRL SS (BLADE)
BNDG COHESIVE 4X5 TAN STRL (GAUZE/BANDAGES/DRESSINGS) IMPLANT
BNDG COHESIVE 6X5 TAN STRL LF (GAUZE/BANDAGES/DRESSINGS) IMPLANT
BNDG GAUZE ELAST 4 BULKY (GAUZE/BANDAGES/DRESSINGS) IMPLANT
BRUSH SCRUB DISP (MISCELLANEOUS) ×2 IMPLANT
BUR 3.5 LG SPHERICAL (BURR) IMPLANT
BUR CUDA 2.9 (BURR) IMPLANT
BUR GATOR 2.9 (BURR) IMPLANT
BUR OVAL 4.0 (BURR) IMPLANT
BUR SPHERICAL 2.9 (BURR) IMPLANT
BURR 3.5 LG SPHERICAL (BURR)
CANISTER SUCT 3000ML (MISCELLANEOUS) IMPLANT
COTTON STERILE ROLL (GAUZE/BANDAGES/DRESSINGS) IMPLANT
CUFF TOURNIQUET SINGLE 34IN LL (TOURNIQUET CUFF) ×2 IMPLANT
CUFF TOURNIQUET SINGLE 44IN (TOURNIQUET CUFF) IMPLANT
DECANTER SPIKE VIAL GLASS SM (MISCELLANEOUS) IMPLANT
DRAPE ARTHROSCOPY W/POUCH 114 (DRAPES) IMPLANT
DRAPE OEC MINIVIEW 54X84 (DRAPES) IMPLANT
DRAPE SURG 17X23 STRL (DRAPES) IMPLANT
DRAPE U-SHAPE 47X51 STRL (DRAPES) ×2 IMPLANT
DRSG ADAPTIC 3X8 NADH LF (GAUZE/BANDAGES/DRESSINGS) ×2 IMPLANT
DRSG PAD ABDOMINAL 8X10 ST (GAUZE/BANDAGES/DRESSINGS) ×2 IMPLANT
ELECT REM PT RETURN 9FT ADLT (ELECTROSURGICAL) ×2
ELECTRODE REM PT RTRN 9FT ADLT (ELECTROSURGICAL) ×1 IMPLANT
GAUZE SPONGE 4X4 12PLY STRL (GAUZE/BANDAGES/DRESSINGS) ×2 IMPLANT
GAUZE XEROFORM 1X8 LF (GAUZE/BANDAGES/DRESSINGS) IMPLANT
GLOVE BIO SURGEON STRL SZ7 (GLOVE) ×4 IMPLANT
GLOVE BIOGEL PI IND STRL 7.5 (GLOVE) ×1 IMPLANT
GLOVE BIOGEL PI IND STRL 8 (GLOVE) ×2 IMPLANT
GLOVE BIOGEL PI INDICATOR 7.5 (GLOVE) ×1
GLOVE BIOGEL PI INDICATOR 8 (GLOVE) ×2
GLOVE SKINSENSE NS SZ8.0 LF (GLOVE)
GLOVE SKINSENSE STRL SZ8.0 LF (GLOVE) IMPLANT
GOWN STRL REUS W/ TWL LRG LVL3 (GOWN DISPOSABLE) ×1 IMPLANT
GOWN STRL REUS W/ TWL XL LVL3 (GOWN DISPOSABLE) ×1 IMPLANT
GOWN STRL REUS W/TWL LRG LVL3 (GOWN DISPOSABLE) ×1
GOWN STRL REUS W/TWL XL LVL3 (GOWN DISPOSABLE) ×1
KIT BASIN OR (CUSTOM PROCEDURE TRAY) ×2 IMPLANT
KIT ROOM TURNOVER OR (KITS) ×2 IMPLANT
KIT STIMULAN RAPID CURE 5CC (Orthopedic Implant) ×2 IMPLANT
NEEDLE HYPO 25X1 1.5 SAFETY (NEEDLE) IMPLANT
NS IRRIG 1000ML POUR BTL (IV SOLUTION) ×2 IMPLANT
PACK ARTHROSCOPY DSU (CUSTOM PROCEDURE TRAY) ×2 IMPLANT
PACK ORTHO EXTREMITY (CUSTOM PROCEDURE TRAY) ×2 IMPLANT
PAD ARMBOARD 7.5X6 YLW CONV (MISCELLANEOUS) ×4 IMPLANT
PADDING CAST ABS 4INX4YD NS (CAST SUPPLIES) ×1
PADDING CAST ABS COTTON 4X4 ST (CAST SUPPLIES) ×1 IMPLANT
PENCIL BUTTON HOLSTER BLD 10FT (ELECTRODE) IMPLANT
SET ARTHROSCOPY TUBING (MISCELLANEOUS)
SET ARTHROSCOPY TUBING LN (MISCELLANEOUS) IMPLANT
SET IRRIG Y TYPE TUR BLADDER L (SET/KITS/TRAYS/PACK) ×2 IMPLANT
STOCKINETTE 6  STRL (DRAPES)
STOCKINETTE 6 STRL (DRAPES) IMPLANT
STRAP ANKLE FOOT DISTRACTOR (ORTHOPEDIC SUPPLIES) IMPLANT
SUCTION FRAZIER TIP 10 FR DISP (SUCTIONS) IMPLANT
SUT ETHILON 2 0 FS 18 (SUTURE) ×4 IMPLANT
SUT ETHILON 4 0 PS 2 18 (SUTURE) ×2 IMPLANT
SUT VIC AB 3-0 PS1 18 (SUTURE)
SUT VIC AB 3-0 PS1 18XBRD (SUTURE) IMPLANT
SYR BULB IRRIGATION 50ML (SYRINGE) IMPLANT
SYR CONTROL 10ML LL (SYRINGE) IMPLANT
TOWEL OR 17X24 6PK STRL BLUE (TOWEL DISPOSABLE) ×2 IMPLANT
TOWEL OR 17X26 10 PK STRL BLUE (TOWEL DISPOSABLE) ×2 IMPLANT
TUBE CONNECTING 12X1/4 (SUCTIONS) ×2 IMPLANT
UNDERPAD 30X30 INCONTINENT (UNDERPADS AND DIAPERS) ×2 IMPLANT
WAND HAND CNTRL MULTIVAC 90 (MISCELLANEOUS) IMPLANT
WATER STERILE IRR 1000ML POUR (IV SOLUTION) ×2 IMPLANT

## 2014-03-24 NOTE — Anesthesia Postprocedure Evaluation (Signed)
Anesthesia Post Note  Patient: [name]  Procedure(s) Performed: Procedure(s) (LRB): IRRIGATION AND DEBRIEDMENT LEFT TIBIAL SEQUESTRECTOMY and APPLICATION OF ANTIBIOTIC BEADS (Left)  Anesthesia type: General  Patient location: PACU  Post pain: Pain level controlled and Adequate analgesia  Post assessment: Post-op Vital signs reviewed, Patient's Cardiovascular Status Stable, Respiratory Function Stable, Patent Airway and Pain level controlled  Last Vitals:  Filed Vitals:   03/24/14 1815  BP: 149/69  Pulse: 74  Temp:   Resp: 16    Post vital signs: Reviewed and stable  Level of consciousness: awake, alert  and oriented  Complications: No apparent anesthesia complications

## 2014-03-24 NOTE — Progress Notes (Signed)
ANTIBIOTIC CONSULT NOTE - INITIAL  Pharmacy Consult for Vancomycin and Zosyn Indication: Osteomyelitis and schronic medial ankle wound  Allergies  Allergen Reactions  . Penicillins Other (See Comments)    Childhood reaction      Patient Measurements: Height: 5' 7.32" (171 cm) Weight: 291 lb 0.1 oz (132 kg) IBW/kg (Calculated) : 66.84 Adjusted Body Weight:   Vital Signs: Temp: 98.2 F (36.8 C) (10/22 2100) Temp Source: Oral (10/22 1342) BP: 147/66 mmHg (10/22 2100) Pulse Rate: 63 (10/22 2100) Intake/Output from previous day:   Intake/Output from this shift:    Labs:  Recent Labs  03/23/14 1313  WBC 7.8  HGB 12.8*  PLT 183  CREATININE 0.81   Estimated Creatinine Clearance: 137 ml/min (by C-G formula based on Cr of 0.81). No results found for this basename: VANCOTROUGH, VANCOPEAK, VANCORANDOM, GENTTROUGH, GENTPEAK, GENTRANDOM, TOBRATROUGH, TOBRAPEAK, TOBRARND, AMIKACINPEAK, AMIKACINTROU, AMIKACIN,  in the last 72 hours   Microbiology: No results found for this or any previous visit (from the past 720 hour(s)).  Medical History: Past Medical History  Diagnosis Date  . Hypertension   . Cancer     crown of scalp, melenoma  . Arthritis     Medications:  Scheduled:  . [START ON 03/25/2014] amLODipine  5 mg Oral Daily  . docusate sodium  100 mg Oral BID  . gabapentin  100 mg Oral QID  . [START ON 03/25/2014] hydrochlorothiazide  25 mg Oral Daily  . [START ON 03/25/2014] insulin aspart  0-15 Units Subcutaneous TID WC  . [START ON 03/25/2014] lisinopril  5 mg Oral Daily  . piperacillin-tazobactam (ZOSYN)  IV  3.375 g Intravenous 3 times per day  . senna  1 tablet Oral BID  . [START ON 03/25/2014] vancomycin  1,250 mg Intravenous Q12H   Assessment: 53 yr old male who has had a chronic infection in an ankle he injured in the 85's. He is here for an I and D and removal of deep implant. He said he had a reaction to penicillin as a child but doesn't know what it  was. Post-op, a pharmacy consult for vancomycin and zosyn was ordered. He received 1 Gm of Vancomycin prior to surgery.  Goal of Therapy:  Vancomycin trough level 15-20 mcg/ml  Plan:  Vancomycin 1250 mg IV q12 hrs starting at 6AM Zosyn 3.375 Gm IV q8h.  Minta Balsam 03/24/2014,9:13 PM

## 2014-03-24 NOTE — Brief Op Note (Signed)
03/24/2014  5:40 PM  PATIENT:  [name]  53 y.o. male  PRE-OPERATIVE DIAGNOSIS:  left tibia osteomyelitis  POST-OPERATIVE DIAGNOSIS:  left tibia osteomyelitis  Procedure(s):  Left tibial sequestrectomy  SURGEON:  Wylene Simmer, MD  ASSISTANT: Lorin Mercy, PA-C  ANESTHESIA:   General  EBL:  minimal   TOURNIQUET:   Total Tourniquet Time Documented: Thigh (Left) - 18 minutes Total: Thigh (Left) - 18 minutes   COMPLICATIONS:  None apparent  DISPOSITION:  Extubated, awake and stable to recovery.  DICTATION ID:  [id number]

## 2014-03-24 NOTE — Anesthesia Preprocedure Evaluation (Signed)
Anesthesia Evaluation  Patient identified by MRN, date of birth, ID band Patient awake    Reviewed: Allergy & Precautions, H&P , NPO status , Patient's Chart, lab work & pertinent test results  Airway Mallampati: II  Neck ROM: full    Dental   Pulmonary neg pulmonary ROS,          Cardiovascular hypertension,     Neuro/Psych    GI/Hepatic   Endo/Other  Morbid obesity  Renal/GU      Musculoskeletal  (+) Arthritis -,   Abdominal   Peds  Hematology   Anesthesia Other Findings   Reproductive/Obstetrics                           Anesthesia Physical Anesthesia Plan  ASA: II  Anesthesia Plan: General   Post-op Pain Management:    Induction: Intravenous  Airway Management Planned: LMA  Additional Equipment:   Intra-op Plan:   Post-operative Plan:   Informed Consent: I have reviewed the patients History and Physical, chart, labs and discussed the procedure including the risks, benefits and alternatives for the proposed anesthesia with the patient or authorized representative who has indicated his/her understanding and acceptance.     Plan Discussed with: CRNA, Anesthesiologist and Surgeon  Anesthesia Plan Comments:         Anesthesia Quick Evaluation

## 2014-03-24 NOTE — Transfer of Care (Signed)
Immediate Anesthesia Transfer of Care Note  Patient: [name]  Procedure(s) Performed: Procedure(s): IRRIGATION AND DEBRIEDMENT LEFT TIBIAL SEQUESTRECTOMY and APPLICATION OF ANTIBIOTIC BEADS (Left)  Patient Location: PACU  Anesthesia Type:General  Level of Consciousness: awake, patient cooperative and responds to stimulation  Airway & Oxygen Therapy: Patient Spontanous Breathing and Patient connected to nasal cannula oxygen  Post-op Assessment: Report given to PACU RN and Post -op Vital signs reviewed and stable  Post vital signs: Reviewed and stable  Complications: No apparent anesthesia complications

## 2014-03-24 NOTE — OR Nursing (Signed)
[name] pain is better controlled after 4mg  IV Dilaudid and 10 mg Oxy IR.  He still states it as a 6/10 but He states it is coming down and is resting comfortably, RR 12, HR 76 and BP 137/61.

## 2014-03-24 NOTE — H&P (Signed)
[name] is an 53 y.o. male.   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  He presents now for I and D with abx beads and possible wound VAC.    Past Medical History  Diagnosis Date  . Hypertension   . Cancer     crown of scalp, melenoma  . Arthritis     Past Surgical History  Procedure Laterality Date  . Left ankle fusion    . Hardware removal Left 12/30/2013    Procedure: LEFT ANKLE REMOVAL OF DEEP IMPLANT/LEFT IRRIGATION AND DEBRIDEMENT WOUND;  Surgeon: Wylene Simmer, MD;  Location: [facility];  Service: Orthopedics;  Laterality: Left;    History reviewed. No pertinent family history. Social History:  reports that he has never smoked. He has never used smokeless tobacco. He reports that he does not drink alcohol or use illicit drugs.  Allergies:  Allergies  Allergen Reactions  . Penicillins Other (See Comments)    Childhood reaction      Medications Prior to Admission  Medication Sig Dispense Refill  . amLODipine (NORVASC) 5 MG tablet Take 5 mg by mouth daily.      Marland Kitchen gabapentin (NEURONTIN) 100 MG capsule Take 100 mg by mouth 4 (four) times daily.      . hydrochlorothiazide (HYDRODIURIL) 25 MG tablet Take 25 mg by mouth daily.      Marland Kitchen ibuprofen (ADVIL,MOTRIN) 200 MG tablet Take 600 mg by mouth every 6 (six) hours as needed for headache (pain).      Marland Kitchen lisinopril (PRINIVIL,ZESTRIL) 5 MG tablet Take 5 mg by mouth daily.      . meloxicam (MOBIC) 7.5 MG tablet Take 7.5 mg by mouth 2 (two) times daily.      . Multiple Vitamins-Minerals (MULTIVITAMIN PO) Take 1 packet by mouth daily. LifeLong Vitality Pack      . sulfamethoxazole-trimethoprim (BACTRIM DS) 800-160 MG per tablet Take 1 tablet by mouth 2 (two) times daily.  60 tablet  3    Results for orders placed during the hospital encounter of 03/23/14 (from the past 48 hour(s))  BASIC METABOLIC PANEL     Status: Abnormal   Collection Time    03/23/14  1:13 PM       Result Value Ref Range   Sodium 143  137 - 147 mEq/L   Potassium 4.1  3.7 - 5.3 mEq/L   Chloride 103  96 - 112 mEq/L   CO2 27  19 - 32 mEq/L   Glucose, Bld 116 (*) 70 - 99 mg/dL   BUN 18  6 - 23 mg/dL   Creatinine, Ser 0.81  0.50 - 1.35 mg/dL   Calcium 9.1  8.4 - 10.5 mg/dL   GFR calc non Af Amer >90  >90 mL/min   GFR calc Af Amer >90  >90 mL/min   Comment: (NOTE)     The eGFR has been calculated using the CKD EPI equation.     This calculation has not been validated in all clinical situations.     eGFR's persistently <90 mL/min signify possible Chronic Kidney     Disease.   Anion gap 13  5 - 15  CBC     Status: Abnormal   Collection Time    03/23/14  1:13 PM      Result Value Ref Range   WBC 7.8  4.0 - 10.5 K/uL   RBC 4.30  4.22 - 5.81 MIL/uL   Hemoglobin  12.8 (*) 13.0 - 17.0 g/dL   HCT 38.0 (*) 39.0 - 52.0 %   MCV 88.4  78.0 - 100.0 fL   MCH 29.8  26.0 - 34.0 pg   MCHC 33.7  30.0 - 36.0 g/dL   RDW 13.6  11.5 - 15.5 %   Platelets 183  150 - 400 K/uL   No results found.  ROS no recent f/c/nv/wt loss.  + drainage from the wound.  Blood pressure 141/67, pulse 71, temperature 97.8 F (36.6 C), temperature source Oral, resp. rate 20, weight 132.45 kg (292 lb), SpO2 98.00%. Physical Exam  Obese male in nad.  A and ox 4.  Mood and affect normal.  EOMI.  ersp unlabored.  L ankle with medial 1 cm wound.  Skin o/w intact.  5/5 strength in PF and DF fo the toes.  Sens to LT intact at the forefoot.  No lymphadenaopthy.  + lymphedema.  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  The risks and benefits of the alternative treatment options have been discussed in detail.  The patient wishes to proceed with surgery and specifically understands risks of bleeding, infection, nerve damage, blood clots, need for additional surgery, amputation and  death.   Wylene Simmer 03-25-2014, 4:39 PM

## 2014-03-25 ENCOUNTER — Encounter (HOSPITAL_COMMUNITY): Payer: Self-pay | Admitting: Orthopedic Surgery

## 2014-03-25 ENCOUNTER — Encounter: Payer: Self-pay | Admitting: Internal Medicine

## 2014-03-25 DIAGNOSIS — M86672 Other chronic osteomyelitis, left ankle and foot: Secondary | ICD-10-CM

## 2014-03-25 DIAGNOSIS — I10 Essential (primary) hypertension: Secondary | ICD-10-CM

## 2014-03-25 DIAGNOSIS — A4902 Methicillin resistant Staphylococcus aureus infection, unspecified site: Secondary | ICD-10-CM

## 2014-03-25 LAB — COMPREHENSIVE METABOLIC PANEL
ALBUMIN: 3.6 g/dL (ref 3.5–5.2)
ALK PHOS: 74 U/L (ref 39–117)
ALT: 25 U/L (ref 0–53)
ANION GAP: 12 (ref 5–15)
AST: 26 U/L (ref 0–37)
BILIRUBIN TOTAL: 0.7 mg/dL (ref 0.3–1.2)
BUN: 12 mg/dL (ref 6–23)
CHLORIDE: 101 meq/L (ref 96–112)
CO2: 27 mEq/L (ref 19–32)
CREATININE: 0.88 mg/dL (ref 0.50–1.35)
Calcium: 8.7 mg/dL (ref 8.4–10.5)
GFR calc Af Amer: >90 mL/min (ref >90)
GFR calc non Af Amer: >90 mL/min (ref >90)
GLUCOSE: 102 mg/dL — AB (ref 70–99)
Potassium: 4.3 mEq/L (ref 3.7–5.3)
Sodium: 140 mEq/L (ref 137–147)
Total Protein: 6.9 g/dL (ref 6.0–8.3)

## 2014-03-25 LAB — CBC WITH DIFFERENTIAL/PLATELET
Basophils Absolute: 0 10*3/uL (ref 0.0–0.1)
Basophils Relative: 0 % (ref 0–1)
Eosinophils Absolute: 0.1 10*3/uL (ref 0.0–0.7)
Eosinophils Relative: 2 % (ref 0–5)
HEMATOCRIT: 37.7 % — AB (ref 39.0–52.0)
HEMOGLOBIN: 12.2 g/dL — AB (ref 13.0–17.0)
LYMPHS ABS: 1.1 10*3/uL (ref 0.7–4.0)
Lymphocytes Relative: 13 % (ref 12–46)
MCH: 29.3 pg (ref 26.0–34.0)
MCHC: 32.4 g/dL (ref 30.0–36.0)
MCV: 90.6 fL (ref 78.0–100.0)
MONO ABS: 0.5 10*3/uL (ref 0.1–1.0)
MONOS PCT: 6 % (ref 3–12)
Neutro Abs: 6.5 10*3/uL (ref 1.7–7.7)
Neutrophils Relative %: 79 % — ABNORMAL HIGH (ref 43–77)
Platelets: 170 10*3/uL (ref 150–400)
RBC: 4.16 MIL/uL — AB (ref 4.22–5.81)
RDW: 13.7 % (ref 11.5–15.5)
WBC: 8.3 10*3/uL (ref 4.0–10.5)

## 2014-03-25 LAB — GLUCOSE, CAPILLARY
GLUCOSE-CAPILLARY: 125 mg/dL — AB (ref 70–99)
Glucose-Capillary: 87 mg/dL (ref 70–99)
Glucose-Capillary: 105 mg/dL — ABNORMAL HIGH (ref 70–99)
Glucose-Capillary: 89 mg/dL (ref 70–99)

## 2014-03-25 LAB — HEPATITIS C ANTIBODY: HCV Ab: NEGATIVE

## 2014-03-25 LAB — C-REACTIVE PROTEIN: CRP: 2.2 mg/dL — ABNORMAL HIGH (ref <0.60)

## 2014-03-25 LAB — SEDIMENTATION RATE: SED RATE: 14 mm/h (ref 0–16)

## 2014-03-25 LAB — HEMOGLOBIN A1C
HEMOGLOBIN A1C: 5.5 % (ref <5.7)
MEAN PLASMA GLUCOSE: 111 mg/dL (ref <117)

## 2014-03-25 LAB — CK: CK TOTAL: 63 U/L (ref 7–232)

## 2014-03-25 LAB — HIV ANTIBODY (ROUTINE TESTING W REFLEX): HIV 1&2 Ab, 4th Generation: NONREACTIVE

## 2014-03-25 MED ORDER — SODIUM CHLORIDE 0.9 % IJ SOLN
10.0000 mL | INTRAMUSCULAR | Status: DC | PRN
  Administered 2014-03-29: 10 mL

## 2014-03-25 MED ORDER — DAPTOMYCIN 500 MG IV SOLR
1000.0000 mg | INTRAVENOUS | Status: DC
  Filled 2014-03-25: qty 20

## 2014-03-25 MED ORDER — DAPTOMYCIN 500 MG IV SOLR
1000.0000 mg | INTRAVENOUS | Status: DC
  Administered 2014-03-26 – 2014-03-29 (×3): 1000 mg via INTRAVENOUS
  Filled 2014-03-25 (×6): qty 20

## 2014-03-25 MED ORDER — SODIUM CHLORIDE 0.9 % IV SOLN
8.0000 mg/kg | INTRAVENOUS | Status: AC
  Administered 2014-03-25: 1056 mg via INTRAVENOUS
  Filled 2014-03-25: qty 21.12

## 2014-03-25 MED ORDER — CIPROFLOXACIN HCL 750 MG PO TABS
750.0000 mg | ORAL_TABLET | Freq: Two times a day (BID) | ORAL | Status: DC
  Administered 2014-03-25 – 2014-03-29 (×9): 750 mg via ORAL
  Filled 2014-03-25 (×11): qty 1

## 2014-03-25 NOTE — Progress Notes (Signed)
[facility]  Patient Status: New pt this admission - pt has been active pt with [facility] in the past  [facility] is providing the following services: HHRN and [facility] for home IV ABX. Pt and wife have self administered IV ABX at home recently.  Will continue to support teaching of IV Daptomycin at home for independence.  Insurance is paying 100% for IV Daptomycin 1000 mg Q 24 hours (current orders). No copay. [facility] will follow Mr. Austria while in the hospital to support DC to home when ordered.   If patient discharges after hours, please call [phone].   Larry Sierras 03/25/2014, 5:15 PM

## 2014-03-25 NOTE — Op Note (Signed)
[name], [name] NO.:  [id number]  MEDICAL RECORD NO.:  [id number]  LOCATION:  5N24C                        FACILITY:  [facility]  PHYSICIAN:  Wylene Simmer, MD        DATE OF BIRTH:  1961/01/10  DATE OF PROCEDURE:  03/24/2014 DATE OF DISCHARGE:                              OPERATIVE REPORT   PREOPERATIVE DIAGNOSIS:  Left tibial osteomyelitis.  POSTOPERATIVE DIAGNOSIS:  Left tibial osteomyelitis.  PROCEDURE:  Left tibial sequestrectomy and excision of chronic medial ankle wound.  SURGEON:  Wylene Simmer, MD  ASSISTANT:  Lorin Mercy, PA-C  ANESTHESIA:  General.  ESTIMATED BLOOD LOSS:  Minimal.  TOURNIQUET TIME:  18 minutes at 200 mmHg.  COMPLICATIONS:  None apparent.  DISPOSITION:  Extubated, awake, and stable to recovery.  INDICATIONS FOR PROCEDURE:  The patient is a 53 year old male with past medical history significant for left ankle arthrodesis many years ago. He has had chronic drainage from a wound for as much as 7 years at a time.  He underwent hardware removal from that ankle approximately 3 months ago.  He has continued drainage from the medial ankle wound.  MRI findings indicate that he has a sequestrum within the medial distal tibia and osteomyelitis.  He presents now for debridement of this area and insertion of antibiotic beads as well as excision of the chronic wound tract.  He understands the risks and benefits, the alternative treatment options, and elects surgical treatment.  He specifically understands risks of bleeding, infection, nerve damage, blood clots, need for additional surgery, continued pain, amputation, and death.  PROCEDURE IN DETAIL:  After preoperative consent was obtained and the correct operative site was identified, the patient was brought to the operating room and placed supine on the operating table.  General anesthesia was induced.  Preoperative antibiotics were held.  Surgical time-out was taken.  Left lower  extremity was prepped and draped in standard sterile fashion with tourniquet around the calf.  The extremity was exsanguinated and the tourniquet was inflated to 200 mmHg.  The patient's wound was identified medially.  It was excised with an ellipsoid incision and the wound was extended proximally and distally. Dissection was carried down to the level of the medial distal tibia adjacent to the previous location of the tibiotalar joint.  The wound was noted to communicate with a hole in the bone.  Curettes were then used to enlarge the hole and remove the sequestrum in its entirety.  The deep tissue was sent as a specimen to microbiology for aerobic and anaerobic culture.  The sequestrum was debrided circumferentially removing all unhealthy-appearing bone and fibrous tissue.  Once a rim of stable healthy-appearing bone was uncovered, the wound was irrigated copiously with 3 L of normal saline.  Stimulan beads mixed with vancomycin were then inserted into the cavity.  The skin incision was closed with retention sutures of 2-0 nylon.  Sterile dressings were applied followed by compression wrap.  Tourniquet was released at 18 minutes.  The patient was awakened from anesthesia and transported to the recovery room in stable condition.  FOLLOWUP PLAN:  The patient will be weightbearing as tolerated on the left lower  extremity.  He will follow up with me in the office in 2 weeks for wound check.  He will have Infectious Disease consultation while he is here at the hospital and will likely need IV antibiotics via PICC line again for 6 weeks.     Wylene Simmer, MD     JH/MEDQ  D:  03/24/2014  T:  03/24/2014  Job:  [id number]

## 2014-03-25 NOTE — Progress Notes (Signed)
Orthopedic Tech Progress Note Patient Details:  [name] 1960-11-14 [id number]  Ortho Devices Type of Ortho Device: Postop shoe/boot Ortho Device/Splint Location: lle Ortho Device/Splint Interventions: Application   Mikalyn Hermida 03/25/2014, 2:13 PM

## 2014-03-25 NOTE — Progress Notes (Signed)
Peripherally Inserted Central Catheter/Midline Placement  The IV Nurse has discussed with the patient and/or persons authorized to consent for the patient, the purpose of this procedure and the potential benefits and risks involved with this procedure.  The benefits include less needle sticks, lab draws from the catheter and patient may be discharged home with the catheter.  Risks include, but not limited to, infection, bleeding, blood clot (thrombus formation), and puncture of an artery; nerve damage and irregular heat beat.  Alternatives to this procedure were also discussed.  PICC/Midline Placement Documentation  PICC / Midline Single Lumen 01/02/14 PICC Right Brachial 43 cm 0 cm (Active)       Holley Bouche Renee 03/25/2014, 5:59 PM

## 2014-03-25 NOTE — Consult Note (Signed)
[facility]  Total days of antibiotics 1        Day 1 Pip Tazo        Day 1 vanc               Reason for Consult: Osteomyleitis- Left talus     Referring Physician: Wylene Simmer, MD  Active Problems:   Ankle osteomyelitis, left  HPI: [name] is a 53 y.o. male with PMH of HTN, chronic wounds and lymphedema of left leg. Pt was admitted yesterday by orthopedics for left tibial sequestrectomy for chronic left tibial osteomyelitis. 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 No fever, malaise, or anorexia. No joint pain in other parts of body.  Pt has a long history with his left leg- which initialy started with stepping on a ice pick in the 1990s, subsequent infection, with ankle fusion and grafts-1995, with hard ware that was present till July this year. He presented in July with cellulitis of the left leg with drainage, for which he had already been taking doxycycline without improvement. He had I and D of wounds, and removal of hardware, with irrigation and debridement. Pt says he had an open wound prior to this procedure which has persisted till now. Cultures of tissue samples  done that that time showed no growth. Pt was placed on Vanc for 6 weeks, cipro was later added. Bactrim was started when his picc line was removed. Bactrim is presently listed on pts med list.  Pt had samples taken for cultures prior to antibiotic use. Pt also says he has been told he has blood flow to his legs- from prior US.Marland Kitchen Has a hx of gout- right knee- 01/2014, confirm with knee aspiration. 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 Pt reports some hearing loss with vancomycin in the past.  Past Medical History  Diagnosis Date  . Hypertension   . Cancer     crown of scalp, melenoma  . Arthritis     Allergies:  Allergies  Allergen Reactions  .  Penicillins Other (See Comments)    Childhood reaction      Current antibiotics: Vanc and Zosyn.   MEDICATIONS: . amLODipine  5 mg Oral Daily  . docusate sodium  100 mg Oral BID  . gabapentin  100 mg Oral QID  . hydrochlorothiazide  25 mg Oral Daily  . insulin aspart  0-15 Units Subcutaneous TID WC  . lisinopril  5 mg Oral Daily  . piperacillin-tazobactam (ZOSYN)  IV  3.375 g Intravenous 3 times per day  . senna  1 tablet Oral BID  . vancomycin  1,250 mg Intravenous Q12H    History  Substance Use Topics  . Smoking status: Never Smoker   . Smokeless tobacco: Never Used  . Alcohol Use: No    History reviewed. No pertinent family history.  Review of Systems -  CONSTITUTIONAL- No Fever, weightloss, night sweat or change in appetite. SKIN- No Rash, colour changes or itching. HEAD- No Headache or dizziness. RESPIRATORY- No Cough or SOB. CARDIAC- No Palpitations, DOE, PND or chest pain. GI- No nausea, vomiting, diarrhoea, constipation, abd pain. URINARY- No Frequency, urgency, straining or dysuria. NEUROLOGIC- No Numbness, syncope, seizures or burning. [facility] [place]- Denies depression or anxiety.  OBJECTIVE: Temp:  [97.8 F (36.6 C)-98.6 F (37 C)] 98.6 F (37 C) (10/23 0650) Pulse Rate:  [  63-77] 76 (10/23 0650) Resp:  [8-20] 12 (10/22 2015) BP: (133-166)/(61-79) 161/68 mmHg (10/23 0650) SpO2:  [94 %-100 %] 99 % (10/23 0650) Weight:  [291 lb 0.1 oz (132 kg)-292 lb (132.45 kg)] 291 lb 0.1 oz (132 kg) (10/22 2100)   Physical exam-  GENERAL- drowsy from pain meds, co-operative, appears as stated age, not in any distress,. Wife at bedside. HEENT- Atraumatic, normocephalic, PERRL, EOMI, oral mucosa appears slightly dry. CARDIAC- RRR, 2/6 systolic murmur heard best aortic area.  RESP- Moving equal volumes of air, and clear to auscultation bilaterally, no wheezes or crackles. ABDOMEN- Soft, nontender, no guarding or rebound, no palpable masses or organomegaly, bowel sounds  present. NEURO- No obvious Cr N abnormality, strenght upper and lower extremities ntact. EXTREMITIES- Clean bandage dressing left lower extremity, extending from metatarsal area to mid leg, hyperpigmented/ erythematous mildly woody skin appearance from mid leg to proximal leg. Did not remove dressing. SKIN- Warm, dry, No rash other parts of body. PSYCH- Normal mood and affect, appropriate thought content and speech.  LABS: Results for orders placed during the hospital encounter of 03/24/14 (from the past 48 hour(s))  TISSUE CULTURE     Status: None   Collection Time    03/24/14  5:24 PM      Result Value Ref Range   Specimen Description TISSUE FOOT LEFT     Special Requests NONE     Gram Stain PENDING     Culture       Value: NO GROWTH     Performed at [facility]   Report Status PENDING    HEMOGLOBIN A1C     Status: None   Collection Time    03/24/14  9:45 PM      Result Value Ref Range   Hemoglobin A1C 5.5  <5.7 %   Comment: (NOTE)                                                                               According to the ADA Clinical Practice Recommendations for 2011, when     HbA1c is used as a screening test:      >=6.5%   Diagnostic of Diabetes Mellitus               (if abnormal result is confirmed)     5.7-6.4%   Increased risk of developing Diabetes Mellitus     References:Diagnosis and Classification of Diabetes Mellitus,Diabetes     ZOXW,9604,54(UJWJX 1):S62-S69 and Standards of Medical Care in             Diabetes - 2011,Diabetes Care,2011,34 (Suppl 1):S11-S61.   Mean Plasma Glucose 111  <117 mg/dL   Comment: Performed at [place], [facility]     Status: None   Collection Time    03/25/14  7:11 AM      Result Value Ref Range   Glucose-Capillary 87  70 - 99 mg/dL   Comment 1 Notify RN      MICRO:   Tissue culture ( Sequestrectomy sample) 03/24/2914- NGTD  IMAGING: No results found.  HISTORICAL  MICRO/IMAGING  Assessment/Plan:   63 Y O Male with hx of mrsa infection, and chronic Left  ankle osteomyelitis s/p hard ware-removal 12/30/2013  tx with 6 wk vancomycin then bactrim. He is POD #1 s/p sequestrectomy on 10/22.   Chronic Osteomyelities-  Presently on vanc and zosyn. Cultures taken before antibiotics pending.  - Will discontinue Vanc as he has a hx of hearing impairment which can occur with Vanc, start daptomycin 36m/kg- 1000 mg daily Will check baseline CK levels.  - Will switch Zosyn to Ciprofloxacin 7534mBID, as at this time might not require anaerobic coverage.  - Will require PICC line and at least 6 weeks of antibiotics. - ESR - CPR  Health maintenance = will check hiv and hep c  HTN- On amlodipine and lisinopril, HCTZ.   EjBing NeighborsMD. PGY-2 IMTS [phone] 11:39 AM 03/25/2014  -------------------------------  I have personally examined, reviewed lab results and prior notes surrounding chronic left ankle infection of Mr. HyCrymesAssessment and Plan have been formulated with Dr. EmDenton Bricknd agree with plans as described above.  Discharge on daptomycin and cipro. He will need weekly bmp and ck level while on daptomycin. We will see him back in the ID clinic with Dr. CaMegan Salonn 2-4 wk.   we will follow up on culture results to see if need to change antibiotics  Graeden Bitner B. SnCattaraugusor Infectious Diseases [phone]

## 2014-03-25 NOTE — Progress Notes (Signed)
Subjective: 1 Day Post-Op Procedure(s) (LRB): IRRIGATION AND DEBRIEDMENT LEFT TIBIAL SEQUESTRECTOMY and APPLICATION OF ANTIBIOTIC BEADS (Left) Patient reports pain as moderate.  Mr. Haque reports feeling okay overall, but is having a moderate amount of pain into his LLE. He is taking both PO and IV pain meds, which seem to be working well. He reports not sleeping very well last night. His appetite has returned. He denies any new HA, CP, SOB, N, V, fever, chills, calf pain or swelling. He has not tried to walk on his foot yet. He was inquisitive about his surgery; all of his questions were answered. He was accompanied by his wife. He remains on IV vanc and zosyn. He understands that cultures were taken and that we have to wait on results for final D/C recs.   Objective: Vital signs in last 24 hours: Temp:  [97.8 F (36.6 C)-98.6 F (37 C)] 98.6 F (37 C) (10/23 0650) Pulse Rate:  [63-77] 76 (10/23 0650) Resp:  [8-20] 12 (10/22 2015) BP: (133-166)/(61-79) 161/68 mmHg (10/23 0650) SpO2:  [94 %-100 %] 99 % (10/23 0650) Weight:  [132 kg (291 lb 0.1 oz)-132.45 kg (292 lb)] 132 kg (291 lb 0.1 oz) (10/22 2100)  Intake/Output from previous day: 10/22 0701 - 10/23 0700 In: 700 [I.V.:700] Out: 200 [Urine:200] Intake/Output this shift:     Recent Labs  03/23/14 1313  HGB 12.8*    Recent Labs  03/23/14 1313  WBC 7.8  RBC 4.30  HCT 38.0*  PLT 183    Recent Labs  03/23/14 1313  NA 143  K 4.1  CL 103  CO2 27  BUN 18  CREATININE 0.81  GLUCOSE 116*  CALCIUM 9.1   No results found for this basename: LABPT, INR,  in the last 72 hours  Physical Exam: WD/WN Caucasian male in nad. A and O x4. Mood and affect appropriate. EOMI. Respirations normal and unlabored. Abdomen soft and non-tender. LLE in soft dressing. Dressings C/D/I. NV intact with brisk capillary refill and 5/5 strength of toe extensors and flexors bilaterally. Distal sensation intact bilaterally. No lymphadenopathy. No  calf swelling or palpable cords.    Assessment/Plan: 1 Day Post-Op Procedure(s) (LRB): IRRIGATION AND DEBRIEDMENT LEFT TIBIAL SEQUESTRECTOMY and APPLICATION OF ANTIBIOTIC BEADS (Left) WBAT on LLE; will order a flat post-op shoe for ambulation Continue pain medications  Continue lovenox for DVT prophylaxis ID consult pending; will follow recs. Cultures pending. For now, continue IV vanc and zosyn   Namiyah Grantham HOWELLS 03/25/2014, 8:24 AM [phone]

## 2014-03-25 NOTE — Progress Notes (Signed)
Utilization review completed.  

## 2014-03-26 LAB — GLUCOSE, CAPILLARY
GLUCOSE-CAPILLARY: 124 mg/dL — AB (ref 70–99)
GLUCOSE-CAPILLARY: 129 mg/dL — AB (ref 70–99)
GLUCOSE-CAPILLARY: 98 mg/dL (ref 70–99)
Glucose-Capillary: 106 mg/dL — ABNORMAL HIGH (ref 70–99)

## 2014-03-26 NOTE — Progress Notes (Signed)
Patient ID: [name], male   DOB: 1960/10/18, 53 y.o.   MRN: [id number] Subjective: 2 Days Post-Op Procedure(s) (LRB): IRRIGATION AND DEBRIEDMENT LEFT TIBIAL SEQUESTRECTOMY and APPLICATION OF ANTIBIOTIC BEADS (Left)    Patient reports pain as moderate.  Questions about diluadid being discontinued  Objective:   VITALS:   Filed Vitals:   03/26/14 0551  BP: 113/65  Pulse: 72  Temp: 98.7 F (37.1 C)  Resp: 18    Incision: dressing C/D/I  LABS  Recent Labs  03/23/14 1313 03/25/14 1213  HGB 12.8* 12.2*  HCT 38.0* 37.7*  WBC 7.8 8.3  PLT 183 170     Recent Labs  03/23/14 1313 03/25/14 1213  NA 143 140  K 4.1 4.3  BUN 18 12  CREATININE 0.81 0.88  GLUCOSE 116* 102*    No results found for this basename: LABPT, INR,  in the last 72 hours   Assessment/Plan: 2 Days Post-Op Procedure(s) (LRB): IRRIGATION AND DEBRIEDMENT LEFT TIBIAL SEQUESTRECTOMY and APPLICATION OF ANTIBIOTIC BEADS (Left)   Advance diet Up with therapy Continue ABX therapy due to Culture taken of surgical site - cultures still pending.  On IV Vanco and Zosyn until cultures return  Per [place] plan to stay until Monday, follow cultures, ID on board to help guide treatment

## 2014-03-26 NOTE — Care Management Note (Signed)
CARE MANAGEMENT NOTE 03/26/2014  Patient:  [name],[name]   Account Number:  [id number]  Date Initiated:  03/25/2014  Documentation initiated by:  Ricki Miller  Subjective/Objective Assessment:   53 yr old male admitted withosteomyelitis of right tiibia. Patient s/p left tibial sequestrectomy and excision of chronic medial ankle wound.     Action/Plan:   Case manager spoke with patient concerning home Health needs. Choice offered. Patient has used [facility] in the past, will do so now for IV antibiotics.   Anticipated DC Date:  03/28/2014   Anticipated DC Plan:  [place]  CM consult      [facility] Choice  HOME HEALTH   Choice offered to / List presented to:  C-1 Patient        [place] arranged  HH-1 RN  IV Antibiotics      [place].   Status of service:  In process, will continue to follow Medicare Important Message given?   (If response is "NO", the following Medicare IM given date fields will be blank) Date Medicare IM given:   Medicare IM given by:   Date Additional Medicare IM given:   Additional Medicare IM given by:    Discharge Disposition:    Per UR Regulation:  Reviewed for med. necessity/level of care/duration of stay

## 2014-03-27 LAB — GLUCOSE, CAPILLARY
GLUCOSE-CAPILLARY: 136 mg/dL — AB (ref 70–99)
GLUCOSE-CAPILLARY: 141 mg/dL — AB (ref 70–99)
Glucose-Capillary: 129 mg/dL — ABNORMAL HIGH (ref 70–99)
Glucose-Capillary: 116 mg/dL — ABNORMAL HIGH (ref 70–99)

## 2014-03-27 NOTE — Progress Notes (Signed)
Subjective: 3 Days Post-Op Procedure(s) (LRB): IRRIGATION AND DEBRIEDMENT LEFT TIBIAL SEQUESTRECTOMY and APPLICATION OF ANTIBIOTIC BEADS (Left) Patient reports pain as 2 on 0-10 scale.    Objective: Vital signs in last 24 hours: Temp:  [98 F (36.7 C)-98.4 F (36.9 C)] 98 F (36.7 C) (10/25 0530) Pulse Rate:  [79-87] 79 (10/25 0530) Resp:  [18] 18 (10/25 0530) BP: (121-137)/(69-75) 137/75 mmHg (10/25 0530) SpO2:  [96 %-99 %] 96 % (10/25 0530)  Intake/Output from previous day: 10/24 0701 - 10/25 0700 In: 1080 [P.O.:960; IV Piggyback:120] Out: -  Intake/Output this shift:     Recent Labs  03/25/14 1213  HGB 12.2*    Recent Labs  03/25/14 1213  WBC 8.3  RBC 4.16*  HCT 37.7*  PLT 170    Recent Labs  03/25/14 1213  NA 140  K 4.3  CL 101  CO2 27  BUN 12  CREATININE 0.88  GLUCOSE 102*  CALCIUM 8.7   No results found for this basename: LABPT, INR,  in the last 72 hours  Neurologically intact Intact pulses distally Dorsiflexion/Plantar flexion intact minimal swelling. Culture pending. Gm stain negative Assessment/Plan: 3 Days Post-Op Procedure(s) (LRB): IRRIGATION AND DEBRIEDMENT LEFT TIBIAL SEQUESTRECTOMY and APPLICATION OF ANTIBIOTIC BEADS (Left) Advance diet Up with therapy Continue Ab  [name] C 03/27/2014, 9:04 AM

## 2014-03-27 NOTE — Discharge Summary (Signed)
Physician Discharge Summary  Patient ID: [name] MRN: [id number] DOB/AGE: 12/02/1960 53 y.o.  Admit date: 03/24/2014 Discharge date: 03/27/2014  Admission Diagnoses: Left ankle osteomyelitis, Obesity   Discharge Diagnoses:  Active Problems:   Ankle osteomyelitis, left   Discharged Condition: good  Hospital Course: [name] presented to [facility] on 10/22 for operative treatment of his left ankle osteomyelitis and chronic wound. He underwent I/D and sequestrectomy of his L tibia with ABX bead placement. He tolerated the procedure well and was transported from the recovery room to the floor in good condition. Tissue cultures were taken intra-operatively, prior to starting IV ABX. He was started on IV vanc and zosyn per pharmacy immediately post-op.  ID was consulted for ABX recs and f/u. ID recs included changing to IV daptomycin and PO cipro. He was made WBAT on the LLE and did well over the weekend.  Tissue cultures through the weekend showed no growth, pending final results. PICC line in place over the weekend.  Today, 10/26, [name] states that he was feeling a bit nauseated this morning, but has not had any antiemetics yet. He denies any other issues other than some pain into his LLE. He denies any new HA, CP, SOB, V, fever, chills, leg pain or swelling. He states that oxycodone is not helping as much as IV pain medications.   Consults: ID  Significant Diagnostic Studies: microbiology: Tissue culture; final results pending (will contact if changes to ABX TX is needed)  Treatments: antibiotics: IV vanc and zosyn; switched to IV daptomycin 1000mg  daily and PO ciprofloxacin 750mg  BID for D/C  Discharge Exam: Blood pressure 123/72, pulse 76, temperature 97.8 F (36.6 C), temperature source Oral, resp. rate 18, height 5' 7.32" (1.71 m), weight 132 kg (291 lb 0.1 oz), SpO2 96.00%. Physical Exam: WD/WN Caucasian male in nad. A and O x4. Mood and affect appropriate. EOMI. Respirations  normal and unlabored. Abdomen soft and non-tender. PICC line to RUE. Soft dressing to LLE. Dressings C/D/I; dressings not removed. NV intact with brisk capillary refill and 5/5 strength of toe extensors and flexors bilaterally. Distal sensation intact bilaterally. No lymphadenopathy. No calf swelling or palpable cords.    Disposition: 01-Home or Self Care     Medication List    ASK your doctor about these medications       amLODipine 5 MG tablet  Commonly known as:  NORVASC  Take 5 mg by mouth daily.     gabapentin 100 MG capsule  Commonly known as:  NEURONTIN  Take 100 mg by mouth 4 (four) times daily.     hydrochlorothiazide 25 MG tablet  Commonly known as:  HYDRODIURIL  Take 25 mg by mouth daily.     ibuprofen 200 MG tablet  Commonly known as:  ADVIL,MOTRIN  Take 600 mg by mouth every 6 (six) hours as needed for headache (pain).     lisinopril 5 MG tablet  Commonly known as:  PRINIVIL,ZESTRIL  Take 5 mg by mouth daily.     meloxicam 7.5 MG tablet  Commonly known as:  MOBIC  Take 7.5 mg by mouth 2 (two) times daily.     MULTIVITAMIN PO  Take 1 packet by mouth daily. LifeLong Vitality Pack     sulfamethoxazole-trimethoprim 800-160 MG per tablet  Commonly known as:  BACTRIM DS  Take 1 tablet by mouth 2 (two) times daily.           Follow-up Information   Follow up with [name] Simmer, MD In  2 weeks. (For wound re-check)    Specialty:  Orthopedic Surgery   Contact information:   [address] [place]  [address] [phone]     Plans: -D/C home 10/26 -WBAT on LLE -PO narcotics for pain control; Rx provided -PICC line in place -ABX: IV daptomycin 1000mg  daily, PO ciprofloxacin 750mg  BID x 6 weeks; Rx provided  -Weekly BMP and CK level needed while on ABX [name]  -F/U with [facility] 2 weeks from surgery -F/U with ID in 2-4 weeks  Signed: Markel Mergenthaler [name] 03/28/2014, 7:30 AM ([phone])  [phone]   Addendum:  After seeing [name] again today, he reports that the nausea has subsided, but that he has pain into the posterior aspect of his R leg and knee. He states that he first noticed it this morning, but did not think anything of it. He does not remember doing anything to bring on the pain.  It is in the posterior aspect of his upper calf and knee; it does not radiate anywhere; it is worse with movement. He denies having anything like this before.  He denies any new HA, CP, SOB, N, V, fever, chills.  He has generalized tenderness to palpation at the posterior aspect of his knee and gastroc.  He also has pain with movement. No palpable cords are noted. Difficult to assess for swelling to due body habitus.   Upon review of new ID notes, Dr. Baxter [name] expresses concern that he may be intolerant of the daptomycin, which may be contributing to his new pains and overall poor feelings. I contacted Dr. Baxter [name] and spoke to her directly. She has placed an order for CK.  If elevated, she will change his antibiotic tx.  If normal, she agrees that it is safe for him to be D/C with his current tx regimen. Blood work is pending at this time. I will order a venous duplex of the RLE to r/o a DVT while waiting for blood results. If all testing is normal, [name] is good for D/C home today.   [name] 03/28/2014  1:25PM ([phone]) [phone]     Addendum 03/29/14  Pt. CK and doppler normal on 10/26. This morning, pt reported increased pain and swelling into his R knee. At his last visit, he developed post-operative gout, similar to this presentation. He denies any new HA, Cp, SOB, N, V, fever, chills.  He is comfortable treating this with an oral steroid dose pak so that he may d/c to home today. He states that he has been up and ambulatory, but that he has some soreness into his R knee.   PE: Knee is slightly warm with moderate swelling. He has some tenderness to palpation.  WD/WN male in  nad. A and O x4. Mood and affect appropriate. EOMI. Respirations normal and unlabored. Abdomen soft and non-tender. Dressings C/D/I. NV intact with brisk capillary refill and 5/5 strength of toe extensors and flexors bilaterally. Distal sensation intact bilaterally. No lymphadenopathy.   Given his history of gout after his previous surgery in this same knee, this is likely another acute flare. Tx options were discussed with the patient in detail. Plan to D/C pt home today with oral medrol dosepak.  He will monitor gout symptoms for improvement. He will contact our office with any new or worsening symptoms. All other plans remain the same.   Ulric Salzman [name] 03/29/2014  4:10PM ([phone]) [phone]

## 2014-03-27 NOTE — Discharge Instructions (Signed)
[name] Simmer, MD [place]  Please read the following information regarding your care after surgery.  Medications  You only need a prescription for the narcotic pain medicine (ex. oxycodone, Percocet, Norco).  All of the other medicines listed below are available over the counter. X acetominophen (Tylenol) 650 mg every 4-6 hours as you need for minor pain X oxycodone as prescribed for moderate to severe pain X Antibiotics as prescribed  Narcotic pain medicine (ex. oxycodone, Percocet, Vicodin) will cause constipation.  To prevent this problem, take the following medicines while you are taking any pain medicine. X docusate sodium (Colace) 100 mg twice a day X senna (Senokot) 2 tablets twice a day    Weight Bearing X Bear weight when you are able on your operated leg or foot.   Cast / Splint / Dressing X Keep your dressings clean and dry.  Dont put anything (coat hanger, pencil, etc) down inside of it.  If it gets damp, use a hair dryer on the cool setting to dry it.  If it gets soaked, call the office to schedule an appointment for a cast change.    After your dressing, cast or splint is removed; you may shower, but do not soak or scrub the wound.  Allow the water to run over it, and then gently pat it dry.  Swelling It is normal for you to have swelling where you had surgery.  To reduce swelling and pain, keep your toes above your nose for at least 3 days after surgery.  It may be necessary to keep your foot or leg elevated for several weeks.  If it hurts, it should be elevated.  Follow Up Call my office at [phone] when you are discharged from the hospital or surgery center to schedule an appointment to be seen two weeks after surgery.  Call my office at [phone] if you develop a fever >101.5 F, nausea, vomiting, bleeding from the surgical site or severe pain.

## 2014-03-28 DIAGNOSIS — M791 Myalgia: Secondary | ICD-10-CM

## 2014-03-28 DIAGNOSIS — M7989 Other specified soft tissue disorders: Secondary | ICD-10-CM

## 2014-03-28 DIAGNOSIS — M79609 Pain in unspecified limb: Secondary | ICD-10-CM

## 2014-03-28 LAB — GLUCOSE, CAPILLARY
GLUCOSE-CAPILLARY: 102 mg/dL — AB (ref 70–99)
Glucose-Capillary: 138 mg/dL — ABNORMAL HIGH (ref 70–99)
Glucose-Capillary: 114 mg/dL — ABNORMAL HIGH (ref 70–99)
Glucose-Capillary: 111 mg/dL — ABNORMAL HIGH (ref 70–99)

## 2014-03-28 LAB — CK: Total CK: 66 U/L (ref 7–232)

## 2014-03-28 LAB — TISSUE CULTURE: Culture: NO GROWTH

## 2014-03-28 MED ORDER — HYDROMORPHONE HCL 2 MG PO TABS: 2.0000 mg | ORAL_TABLET | ORAL | Status: AC | PRN

## 2014-03-28 MED ORDER — CIPROFLOXACIN HCL 750 MG PO TABS: 750.0000 mg | ORAL_TABLET | Freq: Two times a day (BID) | ORAL | Status: DC

## 2014-03-28 NOTE — Progress Notes (Signed)
Call placed to IV team to come draw CK from patient's PICC line.

## 2014-03-28 NOTE — Progress Notes (Signed)
*  Preliminary Results* Right lower extremity venous duplex completed. Right lower extremity is negative for deep vein thrombosis. There is no evidence of right Baker's cyst.  03/28/2014 4:59 PM  Maudry Mayhew, RVT, RDCS, RDMS

## 2014-03-28 NOTE — Progress Notes (Signed)
[place] for Infectious Disease    Date of Admission:  03/24/2014   Total days of antibiotics  4        Day 3 Daptomycin        Day 3 Cipro          ID: Sotero Thaw is a 53 y.o. male with  PMH of male with PMH of HTN, chronic wounds and lymphedema of left leg. 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 S/p sequestrectomy 03/24/2014.   Active Problems:   Ankle osteomyelitis, left  Subjective: No events overnight. Afebrile since admission. Tolerating antibiotics. Felt poorly after walking to bathroom. Having pain behind right knee. Had brief episode of chills this morning.  Medications:  . amLODipine  5 mg Oral Daily  . ciprofloxacin  750 mg Oral BID  . DAPTOmycin (CUBICIN)  IV  1,000 mg Intravenous Q24H  . docusate sodium  100 mg Oral BID  . gabapentin  100 mg Oral QID  . hydrochlorothiazide  25 mg Oral Daily  . insulin aspart  0-15 Units Subcutaneous TID WC  . lisinopril  5 mg Oral Daily  . senna  1 tablet Oral BID    Objective: Vital signs in last 24 hours: Temp:  [97.5 F (36.4 C)-98.4 F (36.9 C)] 97.5 F (36.4 C) (10/26 0452) Pulse Rate:  [65-88] 65 (10/26 0452) Resp:  [18-20] 18 (10/26 0452) BP: (110-133)/(61-72) 110/61 mmHg (10/26 0452) SpO2:  [96 %-99 %] 97 % (10/26 0452)  Physical Exam-  General appearance: alert, cooperative, appears stated age and no distress Resp: clear to auscultation bilaterally Cardio: regular rate and rhythm, S1, S2 normal GI: soft, non-tender; bowel sounds normal; no masses,  no organomegaly Extremities: extremities normal, atraumatic, no cyanosis or edema, clean bandage dressing left lower extremity.  Lab Results  Recent Labs  03/25/14 1213  WBC 8.3  HGB 12.2*  HCT 37.7*  NA 140  K 4.3  CL 101  CO2 27  BUN 12  CREATININE 0.88   Liver Panel  Recent Labs  03/25/14 1213  PROT 6.9  ALBUMIN 3.6  AST 26  ALT 25  ALKPHOS 74  BILITOT 0.7   Sedimentation  Rate  Recent Labs  03/25/14 1213  ESRSEDRATE 14   C-Reactive Protein  Recent Labs  03/25/14 1213  CRP 2.2*    Microbiology: 10/22 Tissue Culture- No growth 10/26 >>   Studies/Results: No results found.  Assessment/Plan:  59 Y O Male with hx of mrsa infection, and chronic Left ankle osteomyelitis s/p hard ware-removal 12/30/2013 tx with 6 wk vancomycin then bactrim. He is POD #1 s/p sequestrectomy on 10/22.   Chronic Osteomyelities- Antibiotics switched to Daptomycin and Cipro ( Vanc and Zosyn discontinued- 10/13) Cultures taken before antibiotics No growth to date. Final pending. CRP elevated at 2.2. Baseline CK- 63.  Plan-  -  Pt to complete 6 weeks of Daptomycin- 8mg /kg daily and Ciprofloxacin- 750mg  BID. Day one- 10/23, which is the Day after surgery(sequestrectomy). Last day- Dec 3rd.  -  Will need weekly CK and BMP while on daptomycin.  - Follow up with Dr. Megan Salon in 2-4 weeks.  Daptomycin- 10/23 >> Cipro- 10/23 >>  Myalgias = will check CK to see if this is related to daptomycin  Health maintenance = Hep C and HIV neg.  HTN- On amlodipine and lisinopril, HCTZ.  Emokpae, Ejiroghene PGY-2 IMTS. Pager: [phone] 03/28/2014, 9:54 AM   ----------------------------- I have personally examined, reviewed results and discussed  plan regarding treatment of chronic osteomyelitis for mr. Fantini.  Concern he may be intolerant of daptomycin. We will check ck. If markedly elevated this might explain him feeling poorly today.  Elzie Rings [place] for Infectious Diseases [phone]

## 2014-03-29 ENCOUNTER — Encounter (HOSPITAL_COMMUNITY): Payer: Self-pay | Admitting: General Practice

## 2014-03-29 ENCOUNTER — Ambulatory Visit: Payer: 59 | Admitting: Internal Medicine

## 2014-03-29 ENCOUNTER — Inpatient Hospital Stay (HOSPITAL_COMMUNITY): Payer: 59

## 2014-03-29 DIAGNOSIS — M25561 Pain in right knee: Secondary | ICD-10-CM

## 2014-03-29 LAB — COMPREHENSIVE METABOLIC PANEL
ALT: 20 U/L (ref 0–53)
AST: 20 U/L (ref 0–37)
Albumin: 3.4 g/dL — ABNORMAL LOW (ref 3.5–5.2)
Alkaline Phosphatase: 73 U/L (ref 39–117)
Anion gap: 14 (ref 5–15)
BUN: 15 mg/dL (ref 6–23)
CO2: 30 meq/L (ref 19–32)
CREATININE: 0.81 mg/dL (ref 0.50–1.35)
Calcium: 9.5 mg/dL (ref 8.4–10.5)
Chloride: 96 mEq/L (ref 96–112)
GFR calc Af Amer: >90 mL/min (ref >90)
GLUCOSE: 114 mg/dL — AB (ref 70–99)
Potassium: 3.5 mEq/L — ABNORMAL LOW (ref 3.7–5.3)
Sodium: 140 mEq/L (ref 137–147)
Total Bilirubin: 0.8 mg/dL (ref 0.3–1.2)
Total Protein: 7.3 g/dL (ref 6.0–8.3)

## 2014-03-29 LAB — CBC WITH DIFFERENTIAL/PLATELET
Basophils Absolute: 0 10*3/uL (ref 0.0–0.1)
Basophils Relative: 0 % (ref 0–1)
Eosinophils Absolute: 0.1 10*3/uL (ref 0.0–0.7)
Eosinophils Relative: 1 % (ref 0–5)
HEMATOCRIT: 38.3 % — AB (ref 39.0–52.0)
HEMOGLOBIN: 12.6 g/dL — AB (ref 13.0–17.0)
LYMPHS ABS: 1.2 10*3/uL (ref 0.7–4.0)
LYMPHS PCT: 11 % — AB (ref 12–46)
MCH: 29.5 pg (ref 26.0–34.0)
MCHC: 32.9 g/dL (ref 30.0–36.0)
MCV: 89.7 fL (ref 78.0–100.0)
MONO ABS: 1 10*3/uL (ref 0.1–1.0)
Monocytes Relative: 9 % (ref 3–12)
NEUTROS ABS: 8.5 10*3/uL — AB (ref 1.7–7.7)
Neutrophils Relative %: 79 % — ABNORMAL HIGH (ref 43–77)
Platelets: 198 10*3/uL (ref 150–400)
RBC: 4.27 MIL/uL (ref 4.22–5.81)
RDW: 13.3 % (ref 11.5–15.5)
WBC: 10.8 10*3/uL — ABNORMAL HIGH (ref 4.0–10.5)

## 2014-03-29 LAB — ANAEROBIC CULTURE

## 2014-03-29 LAB — GLUCOSE, CAPILLARY
GLUCOSE-CAPILLARY: 128 mg/dL — AB (ref 70–99)
Glucose-Capillary: 109 mg/dL — ABNORMAL HIGH (ref 70–99)
Glucose-Capillary: 123 mg/dL — ABNORMAL HIGH (ref 70–99)

## 2014-03-29 MED ORDER — SODIUM CHLORIDE 0.9 % IV SOLN: 1000.0000 mg | INTRAVENOUS | Status: DC

## 2014-03-29 MED ORDER — HEPARIN SOD (PORK) LOCK FLUSH 100 UNIT/ML IV SOLN
250.0000 [IU] | INTRAVENOUS | Status: DC | PRN
  Administered 2014-03-29: 250 [IU]
  Filled 2014-03-29: qty 3

## 2014-03-29 MED ORDER — METHYLPREDNISOLONE (PAK) 4 MG PO TABS: ORAL_TABLET | ORAL | Status: DC

## 2014-03-29 MED ORDER — HEPARIN SOD (PORK) LOCK FLUSH 100 UNIT/ML IV SOLN
250.0000 [IU] | Freq: Every day | INTRAVENOUS | Status: DC
  Filled 2014-03-29: qty 3

## 2014-03-29 NOTE — Progress Notes (Addendum)
[place] for Infectious Disease    Date of Admission:  03/24/2014   Total days of antibiotics  5        Day 4 Daptomycin        Day 4 Cipro          ID: [name] is a 53 y.o. male with  PMH of male with PMH of HTN, chronic wounds and lymphedema of left leg. 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 S/p sequestrectomy 03/24/2014.   Active Problems:   Ankle osteomyelitis, left  Subjective: Pt reports that he can not bear weight/walk on his right leg. He has pain behind his knee. Feels that his right leg is more swollen than usual. Describes pain has sharp and persistent. Cannot tell if joint feels tight. No nausea, chills or fever. No events overnight. Never had pain in the past. Had USS scan  Done today that was negative for clots or bakers cysts.  No chest pain, cough, SOB, abdominal pain or  Diarrhea or vomiting. Good appetite. No fevers,Temperature overnight- highest- 99.   Medications:  . amLODipine  5 mg Oral Daily  . ciprofloxacin  750 mg Oral BID  . DAPTOmycin (CUBICIN)  IV  1,000 mg Intravenous Q24H  . docusate sodium  100 mg Oral BID  . gabapentin  100 mg Oral QID  . hydrochlorothiazide  25 mg Oral Daily  . insulin aspart  0-15 Units Subcutaneous TID WC  . lisinopril  5 mg Oral Daily  . senna  1 tablet Oral BID    Objective: Vital signs in last 24 hours: Temp:  [97.8 F (36.6 C)-99.1 F (37.3 C)] 97.8 F (36.6 C) (10/27 0603) Pulse Rate:  [71-111] 71 (10/27 0603) Resp:  [16] 16 (10/26 1300) BP: (113-146)/(57-73) 146/72 mmHg (10/27 0603) SpO2:  [96 %-100 %] 100 % (10/27 0603)  Physical Exam-  General appearance: alert, cooperative, appears stated age and no distress, lying in bed. Resp: clear to auscultation bilaterally Cardio: regular rate and rhythm, S1, S2 normal GI: soft, non-tender; bowel sounds normal; no masses,  no organomegaly Extremities: Right knee- significant difference in size of right leg  compared to left, swelling extending distally to leg, significant restricted range of motion, tenderness and pain, no redness/ erythema, atraumatic, no cyanosis, clean bandage dressing left lower extremity. Neuro- Alert and oriented.  Microbiology: 10/22 Tissue Culture- No growth 10/27 >>    Assessment/Plan:  39 Y O Male with hx of mrsa infection, and chronic Left ankle osteomyelitis s/p hard ware-removal 12/30/2013 tx with 6 wk vancomycin then bactrim. He is POD #5 s/p sequestrectomy on 10/22.   Chronic Osteomyelities- Antibiotics switched to Daptomycin and Cipro ( Vanc and Zosyn discontinued- 10/13) Cultures taken before antibiotics. No growth to date. Final pending. CRP elevated at 2.2. Baseline CK- 63. Pt with knee pain today, Ck check 10/26- 66. Plan-  - CMP and CBC, Mag. -  Pt to complete 6 weeks of Daptomycin- 8mg /kg daily and Ciprofloxacin- 750mg  BID. Day one- 10/23, which is the Day after surgery(sequestrectomy). Last day- Dec 3rd.  -  Will need weekly CK and BMP while on daptomycin.  - Follow up with Dr. Megan Salon in 2-4 weeks.  Daptomycin- 10/23 >> Cipro- 10/23 >>  Right knee pain = With tenderness and swelling, restricted range of motion. LE dopplers negative for clots or bakers cysts. CK still at baseline, myalgias- confined to leg only which maybe due to underlying hx of arthritis vs. Gouty  arthritis - Xray right knee. - CBC - recommed to get arthrocentesis is it worsens   Health maintenance = Hep C and HIV neg.  HTN- On amlodipine and lisinopril, HCTZ.  Emokpae, Ejiroghene PGY-2 IMTS. Pager: [phone] 03/29/2014, 9:15 AM ----  I have personally examined and reviewed lab results. I agree with assessment and plan as discussed/described by dr. Denton Brick. We will have the patient receive his afternoon dose of daptomycin prior to discharge home today.  Will arrange for follow up in [place]. [place] for Infectious  Diseases [phone]

## 2014-04-06 ENCOUNTER — Other Ambulatory Visit: Payer: Self-pay | Admitting: Family Medicine

## 2014-04-12 ENCOUNTER — Ambulatory Visit (INDEPENDENT_AMBULATORY_CARE_PROVIDER_SITE_OTHER): Payer: 59 | Admitting: Internal Medicine

## 2014-04-12 ENCOUNTER — Encounter: Payer: Self-pay | Admitting: Internal Medicine

## 2014-04-12 VITALS — BP 166/94 | HR 73 | Temp 98.1°F | Wt 285.5 lb

## 2014-04-12 DIAGNOSIS — T847XXS Infection and inflammatory reaction due to other internal orthopedic prosthetic devices, implants and grafts, sequela: Secondary | ICD-10-CM

## 2014-04-12 NOTE — Progress Notes (Signed)
Patient ID: [name], male   DOB: Mar 24, 1961, 53 y.o.   MRN: [id number]         [facility] for Infectious Disease  Patient Active Problem List   Diagnosis Date Noted  . Ankle osteomyelitis, left 03/24/2014  . History of cellulitis 12/31/2013  . Lymphedema of left leg 12/31/2013  . HTN (hypertension) 12/31/2013  . Neuropathy 12/31/2013  . Hyperglycemia 12/31/2013  . Wound infection complicating hardware 97/98/9211    Patient's Medications  New Prescriptions   No medications on file  Previous Medications   AMLODIPINE (NORVASC) 5 MG TABLET    Take 5 mg by mouth daily.   CIPROFLOXACIN (CIPRO) 750 MG TABLET    Take 1 tablet (750 mg total) by mouth 2 (two) times daily.   DAPTOMYCIN 1,000 MG IN SODIUM CHLORIDE 0.9 % 100 ML    Inject 1,000 mg into the vein daily.   GABAPENTIN (NEURONTIN) 100 MG CAPSULE    Take 100 mg by mouth 4 (four) times daily.   HYDROCHLOROTHIAZIDE (HYDRODIURIL) 25 MG TABLET    Take 25 mg by mouth daily.   HYDROMORPHONE (DILAUDID) 2 MG TABLET    Take 1 tablet (2 mg total) by mouth every 4 (four) hours as needed for severe pain.   LISINOPRIL (PRINIVIL,ZESTRIL) 5 MG TABLET    Take 5 mg by mouth daily.   MELOXICAM (MOBIC) 7.5 MG TABLET    Take 7.5 mg by mouth 2 (two) times daily.   METHYLPREDNISOLONE (MEDROL DOSPACK) 4 MG TABLET    follow package directions   MULTIPLE VITAMINS-MINERALS (MULTIVITAMIN PO)    Take 1 packet by mouth daily. LifeLong Vitality Pack  Modified Medications   No medications on file  Discontinued Medications   No medications on file    Subjective: [name] is in for his hospital follow-up visit. He recently underwent repeat surgery for his chronic left tibial osteomyelitis. A small bony sequestrum was removed, antibiotic beads were placed and his sinus tract was excised. Operative Gram stain and cultures were negative again. He was discharged on IV daptomycin and oral ciprofloxacin. He is now completed 20 days of therapy. He has had minimal  drainage on his gauze dressing. He is a little concerned that there may be some increase in swelling and redness around the wound. He has not had any problems tolerating his antibiotics or PICC other than he's noted that his right hand seems to be a little colder than normal. He is eager to get back to work as his short-term disability runs out soon. Review of Systems: Pertinent items are noted in HPI.  Past Medical History  Diagnosis Date  . Hypertension   . Cancer     crown of scalp, melenoma  . Arthritis     History  Substance Use Topics  . Smoking status: Never Smoker   . Smokeless tobacco: Never Used  . Alcohol Use: No    No family history on file.  Allergies  Allergen Reactions  . Penicillins Other (See Comments)    Childhood reaction      Objective: Temp: 98.1 F (36.7 C) (11/10 1009) Temp Source: Oral (11/10 1009) BP: 166/94 mmHg (11/10 1009) Pulse Rate: 73 (11/10 1009)  General: he is in good spirits Skin: right arm PICC site appears normal. He has no abnormal swelling of his right hand or arm. His right hand and arm do not feel cold to the touch. His surgical incision on his left medial ankle appears to be healing well. There is  minimal drainage on his gauze dressing. There are some chronic appearing rubor   Assessment: I'm hopeful that his most recent sequestrectomy followed by 6 weeks of empiric antibiotics will be enough to completely cure his chronic osteomyelitis.  Plan: 1. Continue daptomycin and ciprofloxacin for at least 3 more weeks 2. Follow-up in 3 weeks   Michel Bickers, MD [facility] for [place] [phone] pager   [phone] cell 04/12/2014, 10:27 AM

## 2014-05-03 ENCOUNTER — Ambulatory Visit (INDEPENDENT_AMBULATORY_CARE_PROVIDER_SITE_OTHER): Payer: 59 | Admitting: Internal Medicine

## 2014-05-03 ENCOUNTER — Encounter: Payer: Self-pay | Admitting: Internal Medicine

## 2014-05-03 VITALS — BP 184/109 | HR 66 | Temp 97.9°F | Wt 289.2 lb

## 2014-05-03 DIAGNOSIS — T847XXS Infection and inflammatory reaction due to other internal orthopedic prosthetic devices, implants and grafts, sequela: Secondary | ICD-10-CM

## 2014-05-03 NOTE — Progress Notes (Addendum)
Patient ID: [name], male   DOB: [birth year], 53 y.o.   MRN: [id number]         [facility] for Infectious Disease  Patient Active Problem List   Diagnosis Date Noted  . Ankle osteomyelitis, left 03/24/2014  . History of cellulitis 12/31/2013  . Lymphedema of left leg 12/31/2013  . HTN (hypertension) 12/31/2013  . Neuropathy 12/31/2013  . Hyperglycemia 12/31/2013  . Wound infection complicating hardware 47/42/5956    Patient's Medications  New Prescriptions   No medications on file  Previous Medications   AMLODIPINE (NORVASC) 5 MG TABLET    Take 5 mg by mouth daily.   CALCIUM-VITAMIN D 250-100 MG-UNIT PER TABLET    Take 2 tablets by mouth 2 (two) times daily.   DAPTOMYCIN 1,000 MG IN SODIUM CHLORIDE 0.9 % 100 ML    Inject 1,000 mg into the vein daily.   GABAPENTIN (NEURONTIN) 100 MG CAPSULE    Take 100 mg by mouth 4 (four) times daily.   HYDROCHLOROTHIAZIDE (HYDRODIURIL) 25 MG TABLET    Take 25 mg by mouth daily.   HYDROCODONE-ACETAMINOPHEN (NORCO) 7.5-325 MG PER TABLET    Take 1 tablet by mouth every 12 (twelve) hours as needed for moderate pain.   HYDROMORPHONE (DILAUDID) 2 MG TABLET    Take 1 tablet (2 mg total) by mouth every 4 (four) hours as needed for severe pain.   LISINOPRIL (PRINIVIL,ZESTRIL) 5 MG TABLET    Take 5 mg by mouth daily.   MELOXICAM (MOBIC) 7.5 MG TABLET    Take 7.5 mg by mouth 2 (two) times daily.   METHYLPREDNISOLONE (MEDROL DOSPACK) 4 MG TABLET    follow package directions   MULTIPLE VITAMINS-MINERALS (MULTIVITAMIN PO)    Take 1 packet by mouth daily. LifeLong Vitality Pack  Modified Medications   No medications on file  Discontinued Medications   CIPROFLOXACIN (CIPRO) 750 MG TABLET    Take 1 tablet (750 mg total) by mouth 2 (two) times daily.    Subjective:  Sameer is in for his routine visit. He has now completed 41 days of IV daptomycin following his latest left ankle surgery. He was supposed to be taking oral ciprofloxacin as well but states  that he never got any oral antibiotic after he was discharged. I did check and the discharge summary listed oral ciprofloxacin as did my partner's ( Dr. Baxter Flattery)  Last hospital progress note. He states that his ankle is unchanged. He continues to have moderately severe throbbing pain medially. He has not had any problems tolerating his PICC or daptomycin.  Review of Systems: Pertinent items are noted in HPI.  Past Medical History  Diagnosis Date  . Hypertension   . Cancer     crown of scalp, melenoma  . Arthritis     History  Substance Use Topics  . Smoking status: Never Smoker   . Smokeless tobacco: Never Used  . Alcohol Use: No    No family history on file.  Allergies  Allergen Reactions  . Penicillins Other (See Comments)    Childhood reaction      Objective: Temp: 97.9 F (36.6 C) (12/01 0950) Temp Source: Oral (12/01 0950) BP: 184/109 mmHg (12/01 0959) Pulse Rate: 66 (12/01 0959)  General:  He is in good spirits Skin:  Right arm PICC site appears normal Left leg: no change in chronic edema to mid shin. Surgical incision on the medial ankle has healed. He is quite tender to palpation medially  Lab Results C-reactive protein  04/11/2014 : Elevated further to 4.3   Assessment: This continues to be a very difficult situation. Recent operative cultures were negative  and unfortunately he has not been on the broad empiric antibiotic therapy that was recommended. He had repeat blood work yesterday. I will check to see what his C-reactive protein was and call him tomorrow to discuss antibiotic options.  Plan: 1.  Locate the results of yesterday's lab work 2.  Continue daptomycin for now but I will call him tomorrow to review treatment options 3.  Follow-up in 4 weeks   Michel Bickers, MD [facility] for [place] [facility] [phone] pager   [phone] cell 05/03/2014, 10:20 AM   Addendum: I reviewed Treyden's lab work from November 30. His  sedimentation rate was 27 and his C-reactive protein was down to 0.7. He has been on antibiotics continuously since before his surgery in July, including 6 weeks of IV daptomycin since his last surgery on October 22. I reviewed treatment options with him including stopping all antibiotics, converting to oral antibiotics or continuing IV daptomycin. We both agreed with stopping antibiotics, having the PICC removed and observing closely.  Michel Bickers, MD [facility] for Infectious [place] Group [phone] pager   [phone] cell 05/05/2014, 1:26 PM

## 2014-05-05 NOTE — Addendum Note (Signed)
Addended by: Michel Bickers on: 05/05/2014 01:27 PM   Modules accepted: Orders, Medications

## 2014-05-23 ENCOUNTER — Encounter: Payer: Self-pay | Admitting: Internal Medicine

## 2014-06-07 ENCOUNTER — Ambulatory Visit (INDEPENDENT_AMBULATORY_CARE_PROVIDER_SITE_OTHER): Payer: BLUE CROSS/BLUE SHIELD | Admitting: Internal Medicine

## 2014-06-07 ENCOUNTER — Encounter: Payer: Self-pay | Admitting: Internal Medicine

## 2014-06-07 VITALS — BP 214/126 | HR 73 | Temp 97.8°F | Wt 285.0 lb

## 2014-06-07 DIAGNOSIS — T847XXS Infection and inflammatory reaction due to other internal orthopedic prosthetic devices, implants and grafts, sequela: Secondary | ICD-10-CM

## 2014-06-07 DIAGNOSIS — T847XXD Infection and inflammatory reaction due to other internal orthopedic prosthetic devices, implants and grafts, subsequent encounter: Secondary | ICD-10-CM | POA: Diagnosis not present

## 2014-06-07 NOTE — Progress Notes (Signed)
Patient ID: [name], male   DOB: [birth year], 54 y.o.   MRN: [id number]         [facility] for Infectious Disease  Patient Active Problem List   Diagnosis Date Noted  . Ankle osteomyelitis, left 03/24/2014  . History of cellulitis 12/31/2013  . Lymphedema of left leg 12/31/2013  . HTN (hypertension) 12/31/2013  . Neuropathy 12/31/2013  . Hyperglycemia 12/31/2013  . Wound infection complicating hardware 99/83/3825    Patient's Medications  New Prescriptions   No medications on file  Previous Medications   AMLODIPINE (NORVASC) 5 MG TABLET    Take 5 mg by mouth daily.   CALCIUM-VITAMIN D 250-100 MG-UNIT PER TABLET    Take 2 tablets by mouth 2 (two) times daily.   GABAPENTIN (NEURONTIN) 100 MG CAPSULE    Take 100 mg by mouth 4 (four) times daily.   HYDROCHLOROTHIAZIDE (HYDRODIURIL) 25 MG TABLET    Take 25 mg by mouth daily.   HYDROCODONE-ACETAMINOPHEN (NORCO) 7.5-325 MG PER TABLET    Take 1 tablet by mouth every 12 (twelve) hours as needed for moderate pain.   HYDROMORPHONE (DILAUDID) 2 MG TABLET    Take 1 tablet (2 mg total) by mouth every 4 (four) hours as needed for severe pain.   LISINOPRIL (PRINIVIL,ZESTRIL) 5 MG TABLET    Take 5 mg by mouth daily.   MELOXICAM (MOBIC) 7.5 MG TABLET    Take 7.5 mg by mouth 2 (two) times daily.   METHYLPREDNISOLONE (MEDROL DOSPACK) 4 MG TABLET    follow package directions   MULTIPLE VITAMINS-MINERALS (MULTIVITAMIN PO)    Take 1 packet by mouth daily. LifeLong Vitality Pack  Modified Medications   No medications on file  Discontinued Medications   No medications on file    Subjective: [place] is in for his routine follow-up visit. He completed about 5 months of antibiotic therapy one month ago for his chronic left ankle osteomyelitis. He returned to work yesterday. He continues to have some occasional, scant drainage from his left medial ankle but the lateral ankle incision is completely healed. He has not had any fever or chills. He has not  noted any increased pain, swelling, redness or increased drainage to suggest his infection is flaring up. Review of Systems: Pertinent items are noted in HPI.  Past Medical History  Diagnosis Date  . Hypertension   . Cancer     crown of scalp, melenoma  . Arthritis     History  Substance Use Topics  . Smoking status: Never Smoker   . Smokeless tobacco: Never Used  . Alcohol Use: No    No family history on file.  Allergies  Allergen Reactions  . Penicillins Other (See Comments)    Childhood reaction      Objective: Temp: 97.8 F (36.6 C) (01/05 0946) Temp Source: Oral (01/05 0946) BP: 214/126 mmHg (01/05 0946) Pulse Rate: 73 (01/05 0946)  General:  He is in good spirits Left ankle: he has one drop of bloody drainage in the midportion of his medial ankle incision. He has some chronic, unchanged lower extremity edema and brawny discoloration    Assessment: He is doing well so far off of antibiotics but it will take a longer period of observation to know for certain if his osteomyelitis has been cured.  Plan: 1.  continue observation off of antibiotics 2.  Follow-up in 6 weeks   Michel Bickers, MD [facility] for [place] [facility] [phone] pager   [phone]  cell 06/07/2014, 10:08 AM

## 2014-07-26 ENCOUNTER — Encounter: Payer: Self-pay | Admitting: Internal Medicine

## 2014-07-26 ENCOUNTER — Ambulatory Visit (INDEPENDENT_AMBULATORY_CARE_PROVIDER_SITE_OTHER): Payer: BLUE CROSS/BLUE SHIELD | Admitting: Internal Medicine

## 2014-07-26 VITALS — BP 197/120 | HR 97 | Temp 97.5°F | Wt 296.5 lb

## 2014-07-26 DIAGNOSIS — T847XXD Infection and inflammatory reaction due to other internal orthopedic prosthetic devices, implants and grafts, subsequent encounter: Secondary | ICD-10-CM

## 2014-07-26 DIAGNOSIS — T847XXS Infection and inflammatory reaction due to other internal orthopedic prosthetic devices, implants and grafts, sequela: Secondary | ICD-10-CM | POA: Diagnosis not present

## 2014-07-26 NOTE — Progress Notes (Signed)
Patient ID: [name], male   DOB: 03/21/1961, 54 y.o.   MRN: [id number]         [facility] for Infectious Disease  Patient Active Problem List   Diagnosis Date Noted  . Ankle osteomyelitis, left 03/24/2014  . History of cellulitis 12/31/2013  . Lymphedema of left leg 12/31/2013  . HTN (hypertension) 12/31/2013  . Neuropathy 12/31/2013  . Hyperglycemia 12/31/2013  . Wound infection complicating hardware 75/03/2584    Patient's Medications  New Prescriptions   No medications on file  Previous Medications   AMLODIPINE (NORVASC) 5 MG TABLET    Take 5 mg by mouth daily.   CALCIUM-VITAMIN D 250-100 MG-UNIT PER TABLET    Take 2 tablets by mouth 2 (two) times daily.   GABAPENTIN (NEURONTIN) 100 MG CAPSULE    Take 100 mg by mouth 4 (four) times daily.   HYDROCHLOROTHIAZIDE (HYDRODIURIL) 25 MG TABLET    Take 25 mg by mouth daily.   HYDROCODONE-ACETAMINOPHEN (NORCO) 7.5-325 MG PER TABLET    Take 1 tablet by mouth every 12 (twelve) hours as needed for moderate pain.   HYDROMORPHONE (DILAUDID) 2 MG TABLET    Take 1 tablet (2 mg total) by mouth every 4 (four) hours as needed for severe pain.   LISINOPRIL (PRINIVIL,ZESTRIL) 5 MG TABLET    Take 5 mg by mouth daily.   MELOXICAM (MOBIC) 7.5 MG TABLET    Take 7.5 mg by mouth 2 (two) times daily.   METHYLPREDNISOLONE (MEDROL DOSPACK) 4 MG TABLET    follow package directions   MULTIPLE VITAMINS-MINERALS (MULTIVITAMIN PO)    Take 1 packet by mouth daily. LifeLong Vitality Pack  Modified Medications   No medications on file  Discontinued Medications   No medications on file    Subjective: [name] is in for his routine follow-up. He is now been off of all antibiotics since early December. He completed 5 months of total antibiotic therapy for his left ankle infection. He has not had any further drainage from the medial ankle incision and over one month. He no longer has any pain in his foot. He does have swelling in his left lower leg that worsens  throughout the day. That has been a chronic problem. It is not any worse. He has tried compressive stockings in the past but felt that they did not help.  Review of Systems: Pertinent items are noted in HPI.  Past Medical History  Diagnosis Date  . Hypertension   . Cancer     crown of scalp, melenoma  . Arthritis     History  Substance Use Topics  . Smoking status: Never Smoker   . Smokeless tobacco: Never Used  . Alcohol Use: No    No family history on file.  Allergies  Allergen Reactions  . Penicillins Other (See Comments)    Childhood reaction      Objective: Temp: 97.5 F (36.4 C) (02/23 1051) Temp Source: Oral (02/23 1051) BP: 197/120 mmHg (02/23 1051) Pulse Rate: 97 (02/23 1051)  General: He is in good spirits Extremities: He has pitting edema of his left leg to the level of the knee with venous stasis changes. His surgical incisions on his medial and lateral left ankle are healed without evidence of active infection.    Assessment: I am quite hopeful that his infection has been cured.  Plan: 1. Continue observation off of antibiotics 2. Follow-up here as needed   [name] Bickers, MD [facility] for Infectious [place]  [phone] pager   [phone] cell 07/26/2014, 11:07 AM

## 2014-08-29 ENCOUNTER — Telehealth: Payer: Self-pay | Admitting: Licensed Clinical Social Worker

## 2014-08-29 NOTE — Telephone Encounter (Signed)
Patient has new wound that appeared on his ankle and is requesting an appointment with Dr. Megan Salon, there are no available appointments, I will forward to Dr. Megan Salon to see if the patient needs to be seen. Patient denies redness or swelling.

## 2014-08-29 NOTE — Telephone Encounter (Signed)
Left patient a voicemail to call back and confirm

## 2014-08-29 NOTE — Telephone Encounter (Signed)
Overbook him tomorrow at 11:30.

## 2014-08-30 ENCOUNTER — Ambulatory Visit (INDEPENDENT_AMBULATORY_CARE_PROVIDER_SITE_OTHER): Payer: BLUE CROSS/BLUE SHIELD | Admitting: Internal Medicine

## 2014-08-30 ENCOUNTER — Encounter: Payer: Self-pay | Admitting: Internal Medicine

## 2014-08-30 DIAGNOSIS — T847XXD Infection and inflammatory reaction due to other internal orthopedic prosthetic devices, implants and grafts, subsequent encounter: Secondary | ICD-10-CM | POA: Diagnosis not present

## 2014-08-30 LAB — CBC
HCT: 45.3 % (ref 39.0–52.0)
Hemoglobin: 15 g/dL (ref 13.0–17.0)
MCH: 29.2 pg (ref 26.0–34.0)
MCHC: 33.1 g/dL (ref 30.0–36.0)
MCV: 88.1 fL (ref 78.0–100.0)
MPV: 11.4 fL (ref 8.6–12.4)
PLATELETS: 210 10*3/uL (ref 150–400)
RBC: 5.14 MIL/uL (ref 4.22–5.81)
RDW: 13.8 % (ref 11.5–15.5)
WBC: 9.3 10*3/uL (ref 4.0–10.5)

## 2014-08-30 LAB — BASIC METABOLIC PANEL
BUN: 10 mg/dL (ref 6–23)
CALCIUM: 10.1 mg/dL (ref 8.4–10.5)
CHLORIDE: 104 meq/L (ref 96–112)
CO2: 30 meq/L (ref 19–32)
CREATININE: 0.7 mg/dL (ref 0.50–1.35)
Glucose, Bld: 92 mg/dL (ref 70–99)
Potassium: 4.4 mEq/L (ref 3.5–5.3)
SODIUM: 144 meq/L (ref 135–145)

## 2014-08-30 LAB — C-REACTIVE PROTEIN: CRP: <0.5 mg/dL (ref <0.60)

## 2014-08-30 NOTE — Progress Notes (Addendum)
Patient ID: [name], male   DOB: 1961-02-13, 54 y.o.   MRN: [id number]         [facility]  Patient Active Problem List   Diagnosis Date Noted  . Ankle osteomyelitis, left 03/24/2014  . History of cellulitis 12/31/2013  . Lymphedema of left leg 12/31/2013  . HTN (hypertension) 12/31/2013  . Neuropathy 12/31/2013  . Hyperglycemia 12/31/2013  . Wound infection complicating hardware 47/42/5956    Patient's Medications  New Prescriptions   No medications on file  Previous Medications   AMLODIPINE (NORVASC) 5 MG TABLET    Take 5 mg by mouth daily.   CALCIUM-VITAMIN D 250-100 MG-UNIT PER TABLET    Take 2 tablets by mouth 2 (two) times daily.   GABAPENTIN (NEURONTIN) 100 MG CAPSULE    Take 100 mg by mouth 4 (four) times daily.   HYDROCHLOROTHIAZIDE (HYDRODIURIL) 25 MG TABLET    Take 25 mg by mouth daily.   HYDROCODONE-ACETAMINOPHEN (NORCO) 7.5-325 MG PER TABLET    Take 1 tablet by mouth every 12 (twelve) hours as needed for moderate pain.   HYDROMORPHONE (DILAUDID) 2 MG TABLET    Take 1 tablet (2 mg total) by mouth every 4 (four) hours as needed for severe pain.   LISINOPRIL (PRINIVIL,ZESTRIL) 5 MG TABLET    Take 5 mg by mouth daily.   MELOXICAM (MOBIC) 7.5 MG TABLET    Take 7.5 mg by mouth 2 (two) times daily.   MULTIPLE VITAMINS-MINERALS (MULTIVITAMIN PO)    Take 1 packet by mouth daily. LifeLong Vitality Pack  Modified Medications   No medications on file  Discontinued Medications   METHYLPREDNISOLONE (MEDROL DOSPACK) 4 MG TABLET    follow package directions    Subjective: [name] is seen on a work in basis. 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 He had several surgeries last year including one to remove all hardware. He completed 6 months of antibiotic therapy in December and did well until about 3 weeks ago when he had a spontaneous development of a recurrent ulcer over his left lateral ankle. The ulcer has been slowly enlarging. He will  have occasional drainage of small amounts of clear fluid. The chronic swelling in his left ankle may be slightly worse. He has not had any fever, chills or sweats. He has not been back on any antibiotics.  Review of Systems: Pertinent items are noted in HPI.  Past Medical History  Diagnosis Date  . Hypertension   . Cancer     crown of scalp, melenoma  . Arthritis     History  Substance Use Topics  . Smoking status: Never Smoker   . Smokeless tobacco: Never Used  . Alcohol Use: No    No family history on file.  Allergies  Allergen Reactions  . Penicillins Other (See Comments)    Childhood reaction      Objective: Temp: 98.1 F (36.7 C) (03/29 1144) Temp Source: Oral (03/29 1144) BP: 185/111 mmHg (03/29 1144) Pulse Rate: 81 (03/29 1144)  General: He is in good spirits Extremities: He has pitting edema of his left leg to the level of the knee with venous stasis changes that is unchanged. He has an irregular shaped superficial ulcer over the left lateral malleolus measuring approximately 15 x 25 mm. There is no expressible drainage. There is no surrounding cellulitis or fluctuance.  SED RATE (mm/hr)  Date Value  03/25/2014 14   CRP (mg/dL)  Date Value  03/25/2014 2.2*  Assessment: I'm concerned about persistence of deep infection in his ankle. 3 separate cultures last year were all negative but he was on antibiotics before each were taken. I will repeat an MRI scan and blood work. I will keep him off of antibiotics for now in case we need to get deep biopsies again.  Plan: 1. Continue observation off of antibiotics 2. CBC, basic metabolic panel, sedimentation rate and C-reactive protein 3. Left ankle MRI 4. Follow-up after MRI completed   Michel Bickers, MD [place] [facility] [phone] pager   [phone] cell 08/30/2014, 12:28 PM   Addendum:  BMET    Component Value Date/Time   NA 144 08/30/2014 1212   K 4.4  08/30/2014 1212   CL 104 08/30/2014 1212   CO2 30 08/30/2014 1212   GLUCOSE 92 08/30/2014 1212   BUN 10 08/30/2014 1212   CREATININE 0.70 08/30/2014 1212   CREATININE 0.81 03/29/2014 0940   CALCIUM 10.1 08/30/2014 1212   GFRNONAA >90 03/29/2014 0940   GFRAA >90 03/29/2014 0940   Lab Results  Component Value Date   WBC 9.3 08/30/2014   HGB 15.0 08/30/2014   HCT 45.3 08/30/2014   MCV 88.1 08/30/2014   PLT 210 08/30/2014   SED RATE (mm/hr)  Date Value  08/30/2014 12  03/25/2014 14   CRP (mg/dL)  Date Value  08/30/2014 <0.5  03/25/2014 2.2*   His repeat left ankle MRI scan did not show any evidence of persistent or recurrent osteomyelitis or abscess. I suspect that his new ulcer on his left lateral ankle is related to chronic venous stasis. I spoke to him today by phone and he is in agreement with referral to the wound clinic.  Michel Bickers, MD [facility] [employer] [phone] pager   [phone] cell 09/06/2014, 3:17 PM

## 2014-08-31 LAB — SEDIMENTATION RATE: Sed Rate: 12 mm/hr (ref 0–20)

## 2014-09-07 ENCOUNTER — Telehealth: Payer: Self-pay | Admitting: *Deleted

## 2014-09-07 NOTE — Telephone Encounter (Signed)
Faxed 09/05/14 MRI results to Dr. Nona Dell office.

## 2014-09-07 NOTE — Telephone Encounter (Signed)
Dr. Megan Salon requested that the pt f/u with Dr. Doran Durand.

## 2014-10-21 ENCOUNTER — Encounter: Payer: Self-pay | Admitting: Internal Medicine

## 2015-05-05 ENCOUNTER — Encounter (HOSPITAL_BASED_OUTPATIENT_CLINIC_OR_DEPARTMENT_OTHER): Payer: BLUE CROSS/BLUE SHIELD | Attending: Internal Medicine

## 2016-07-21 IMAGING — CR DG KNEE COMPLETE 4+V*R*
4 series · 4 of 4 positions shown · non-contrast
Comparison: None.

CLINICAL DATA: Right knee pain within the last few days. No known
injury. Recent surgery on the right ankle to debride infection.

EXAM:
RIGHT KNEE - COMPLETE 4+ VIEW

[x knee lat right (1 of 4)]
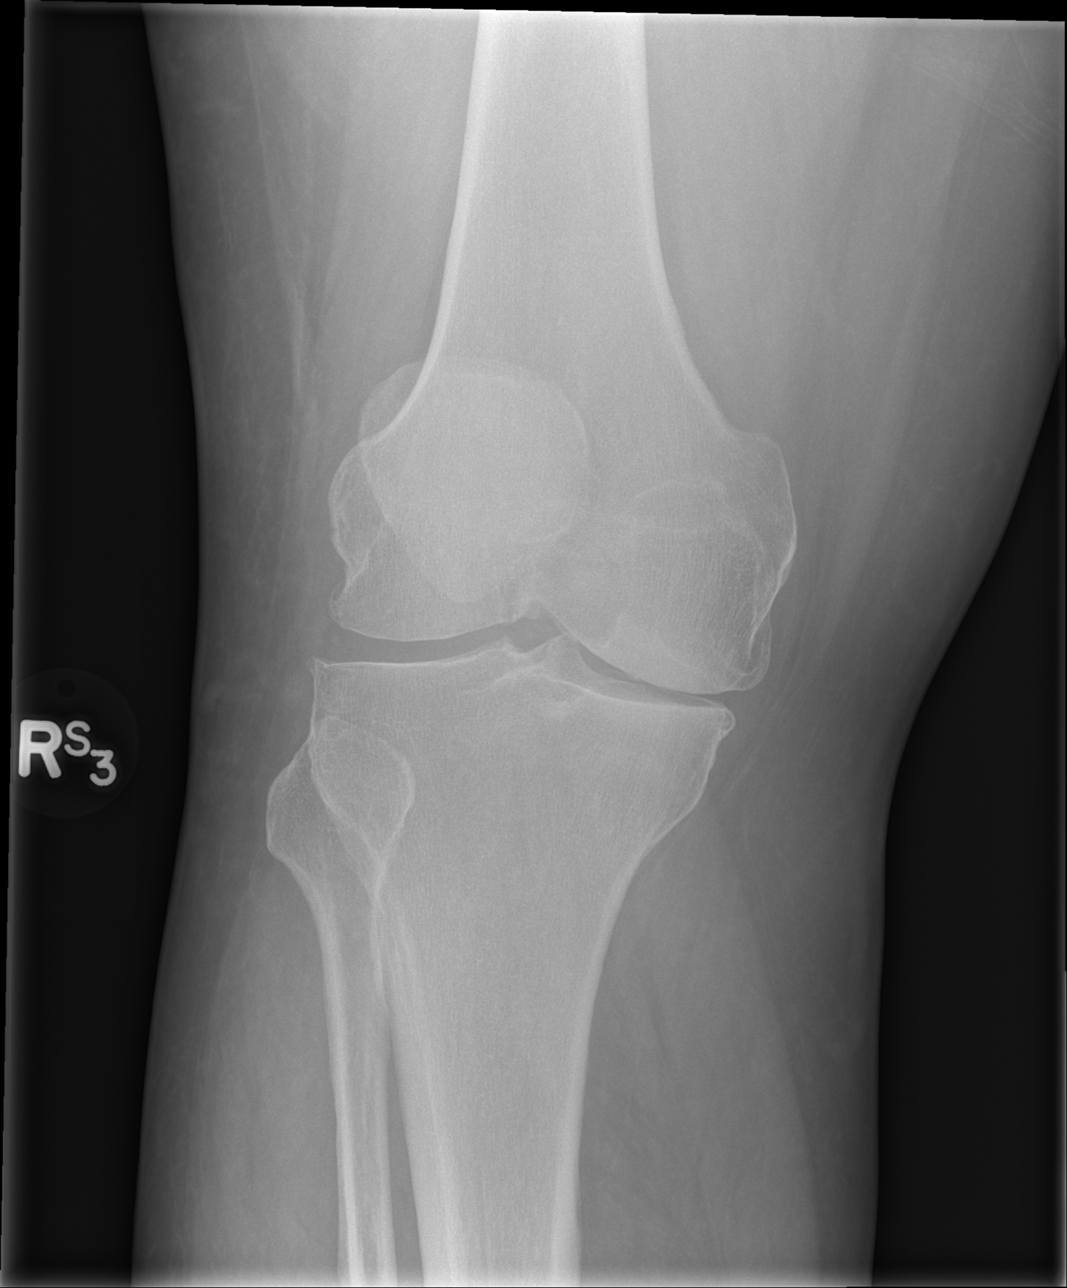

[x knee lat right (2 of 4)]
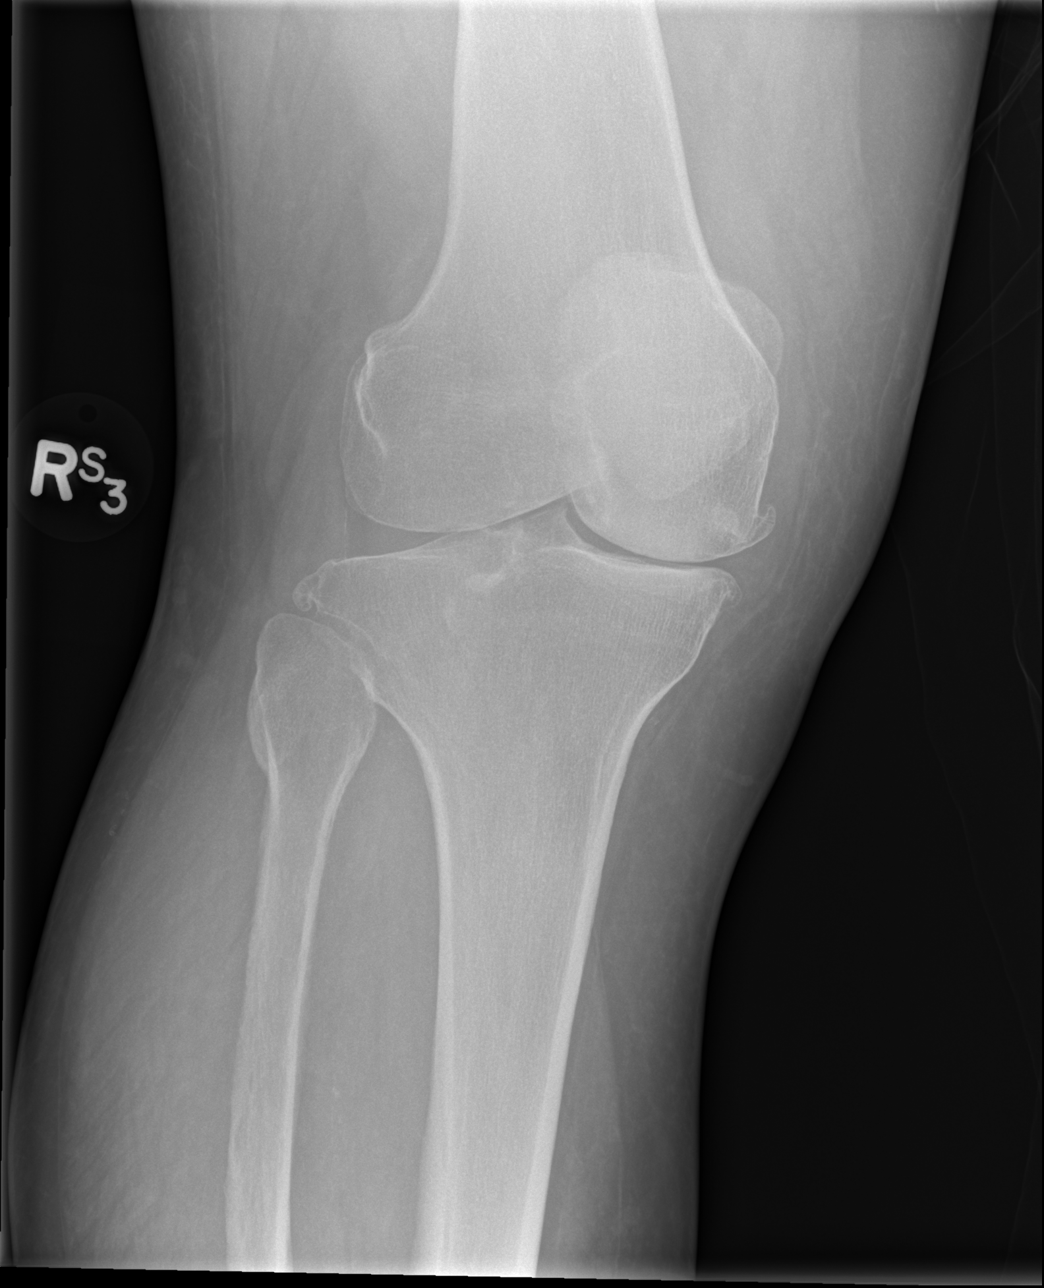

[x knee lat right (3 of 4)]
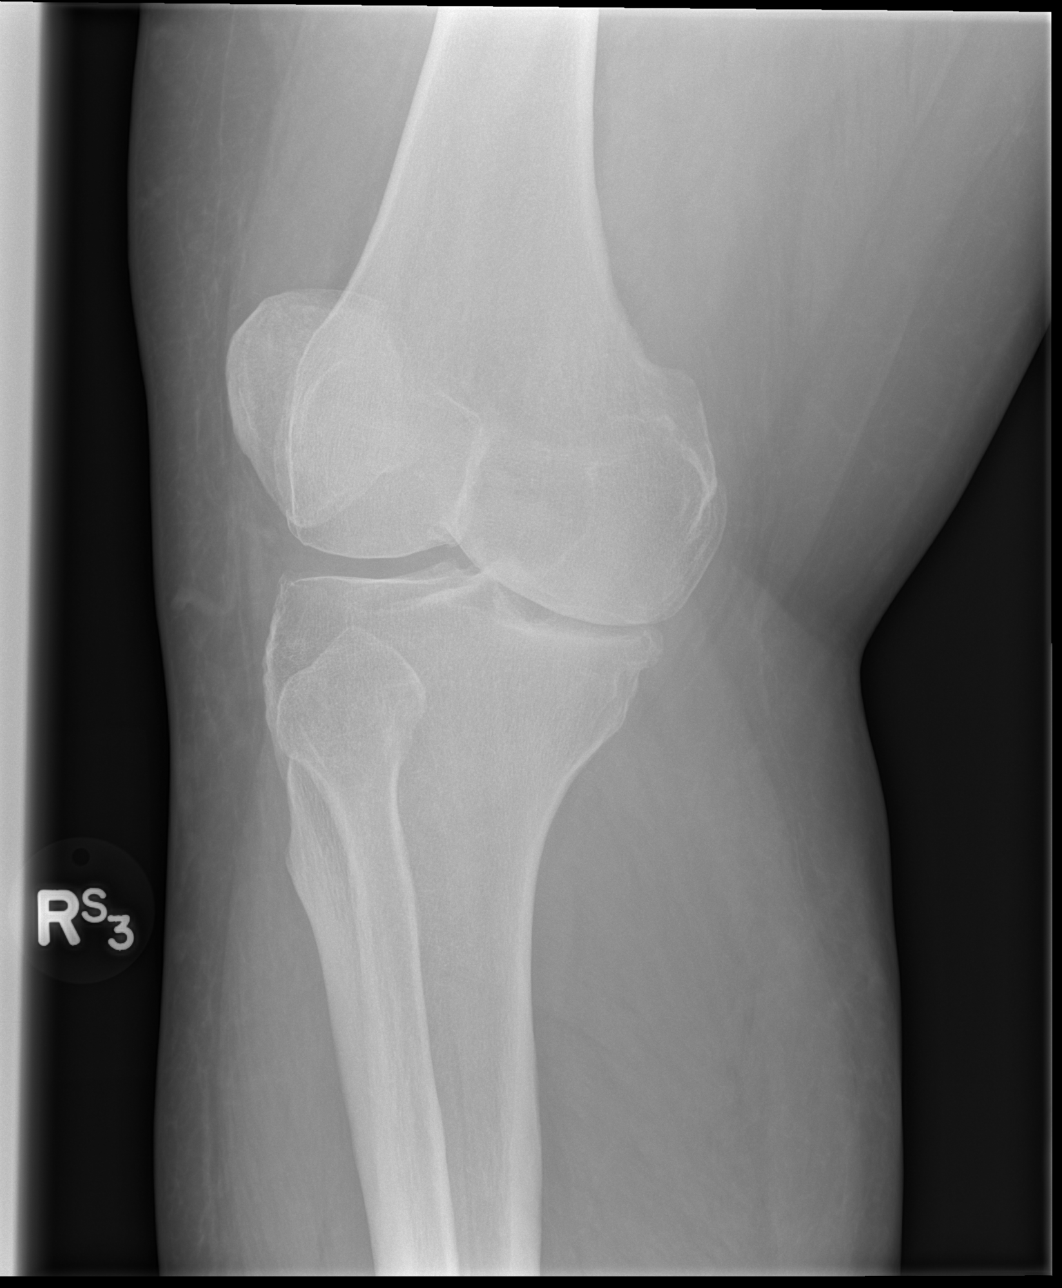

[x knee lat right (4 of 4)]
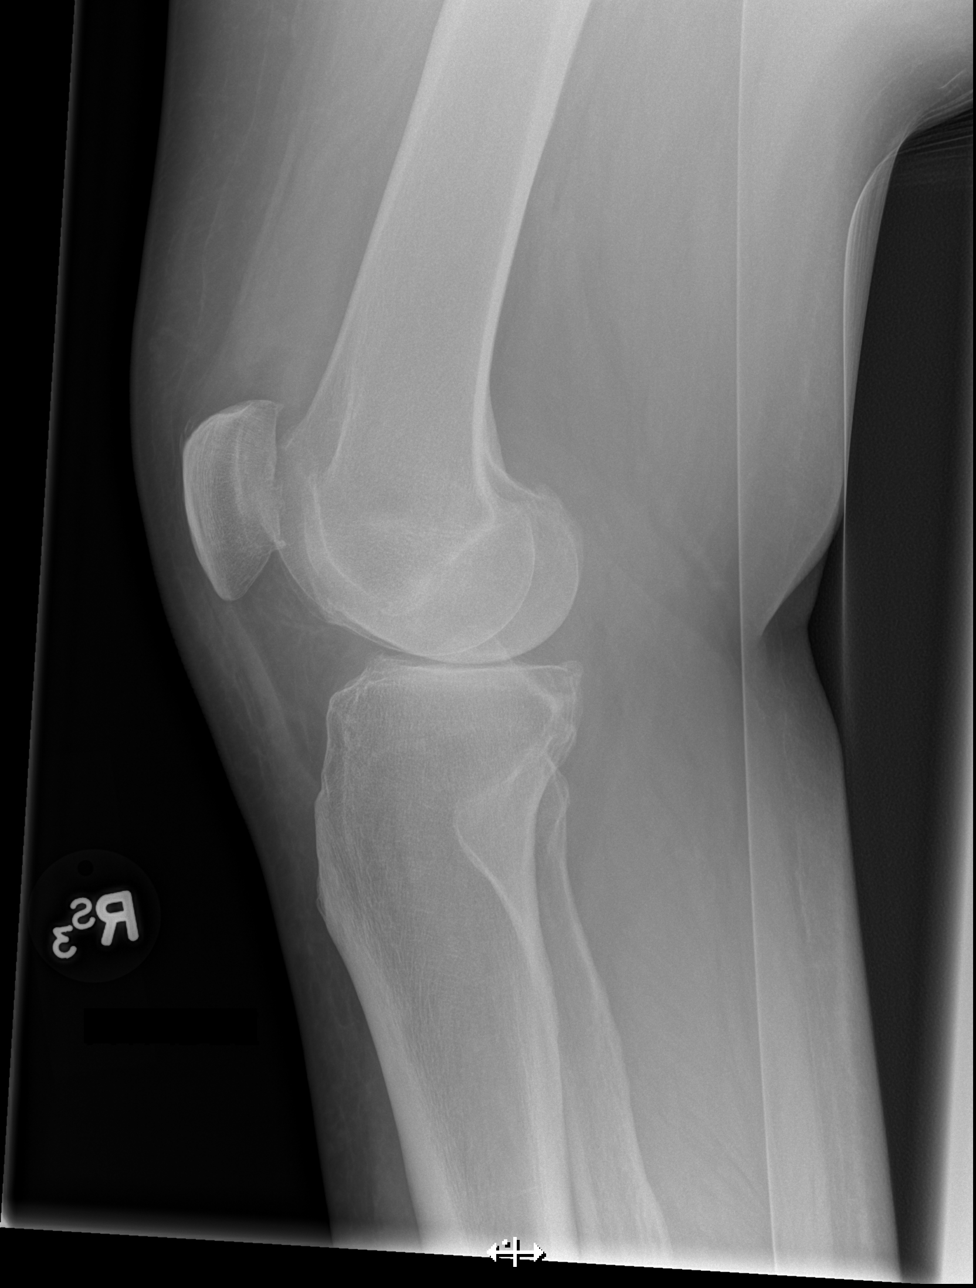

[4 of 4 positions shown; findings below may reference images not displayed]

FINDINGS: Moderate medial joint space compartment narrowing. Small marginal
osteophytes from all 3 compartments most evident from the medial
compartment. No fracture or bone lesion. Small joint effusion. Soft
tissues are unremarkable. Bones are demineralized.
IMPRESSION: 1. No fracture or acute finding.
2. Osteoarthritis.  Small joint effusion.
